# Patient Record
Sex: Female | Born: 1980 | Race: White | Hispanic: No | Marital: Single | Smoking: Never smoker
Health system: Southern US, Community
[De-identification: ages and names within clinical notes are randomized; demographics above are authoritative.]

## PROBLEM LIST (undated history)

## (undated) DIAGNOSIS — I10 Essential (primary) hypertension: Secondary | ICD-10-CM

## (undated) DIAGNOSIS — E785 Hyperlipidemia, unspecified: Secondary | ICD-10-CM

## (undated) DIAGNOSIS — E119 Type 2 diabetes mellitus without complications: Secondary | ICD-10-CM

## (undated) HISTORY — PX: TONSILLECTOMY AND ADENOIDECTOMY: SUR1326

## (undated) HISTORY — PX: TUBAL LIGATION: SHX77

## (undated) HISTORY — PX: WISDOM TOOTH EXTRACTION: SHX21

## (undated) HISTORY — DX: Hyperlipidemia, unspecified: E78.5

## (undated) HISTORY — DX: Type 2 diabetes mellitus without complications: E11.9

## (undated) HISTORY — DX: Essential (primary) hypertension: I10

## (undated) HISTORY — PX: MYRINGOTOMY: SUR874

---

## 2011-04-21 ENCOUNTER — Emergency Department (HOSPITAL_COMMUNITY)
Admission: EM | Admit: 2011-04-21 | Discharge: 2011-04-21 | Disposition: A | Discharge status: Other Acute Inpatient Hospital | Payer: Medicaid Other | Attending: Emergency Medicine | Admitting: Emergency Medicine

## 2011-04-21 ENCOUNTER — Encounter: Payer: Self-pay | Admitting: *Deleted

## 2011-04-21 DIAGNOSIS — J069 Acute upper respiratory infection, unspecified: Secondary | ICD-10-CM | POA: Insufficient documentation

## 2011-04-21 DIAGNOSIS — H9209 Otalgia, unspecified ear: Secondary | ICD-10-CM | POA: Insufficient documentation

## 2011-04-21 DIAGNOSIS — J3489 Other specified disorders of nose and nasal sinuses: Secondary | ICD-10-CM | POA: Insufficient documentation

## 2011-04-21 DIAGNOSIS — S0085XA Superficial foreign body of other part of head, initial encounter: Secondary | ICD-10-CM | POA: Insufficient documentation

## 2011-04-21 DIAGNOSIS — R05 Cough: Secondary | ICD-10-CM | POA: Insufficient documentation

## 2011-04-21 DIAGNOSIS — S1095XA Superficial foreign body of unspecified part of neck, initial encounter: Secondary | ICD-10-CM | POA: Insufficient documentation

## 2011-04-21 DIAGNOSIS — X58XXXA Exposure to other specified factors, initial encounter: Secondary | ICD-10-CM | POA: Insufficient documentation

## 2011-04-21 DIAGNOSIS — S0005XA Superficial foreign body of scalp, initial encounter: Secondary | ICD-10-CM | POA: Insufficient documentation

## 2011-04-21 DIAGNOSIS — J343 Hypertrophy of nasal turbinates: Secondary | ICD-10-CM | POA: Insufficient documentation

## 2011-04-21 DIAGNOSIS — R059 Cough, unspecified: Secondary | ICD-10-CM | POA: Insufficient documentation

## 2011-04-21 DIAGNOSIS — R6889 Other general symptoms and signs: Secondary | ICD-10-CM | POA: Insufficient documentation

## 2011-04-21 DIAGNOSIS — T148XXA Other injury of unspecified body region, initial encounter: Secondary | ICD-10-CM

## 2011-04-21 NOTE — ED Notes (Signed)
The pt has had a cold and congestion for one week and she has swollen glands in her neck since yesterday

## 2011-04-21 NOTE — ED Provider Notes (Signed)
History     CSN: [id number] Arrival date & time: 04/21/2011  3:44 PM   Chief Complaint  Patient presents with  . Nasal Congestion    HPI Pt was seen at 1725.  Per pt, c/o gradual onset and persistence of constant runny/stuffy nose, ears and sinus congestion, cough x1 week.  Denies sore throat, no CP/palpitations, no SOB, no rash, no fevers, no abd pain, no N/V/D.      History reviewed. No pertinent past medical history.  History reviewed. No pertinent past surgical history.   History  Substance Use Topics  . Smoking status: Not on file  . Smokeless tobacco: Not on file  . Alcohol Use: No    Review of Systems ROS: Statement: All systems negative except as marked or noted in the HPI; Constitutional: Negative for fever and chills. ; ; Eyes: Negative for eye pain, redness and discharge. ; ; ENMT: Positive for ear pain, nasal congestion, sinus pressure and negative for sore throat. ; ; Cardiovascular: Negative for chest pain, palpitations, diaphoresis, dyspnea and peripheral edema. ; ; Respiratory: +cough. Negative for wheezing and stridor. ; ; Gastrointestinal: Negative for nausea, vomiting, diarrhea and abdominal pain, blood in stool, hematemesis, jaundice and rectal bleeding. . ; ; Genitourinary: Negative for dysuria, flank pain and hematuria. ; ; Musculoskeletal: Negative for back pain and neck pain. Negative for swelling and trauma.; ; Skin: Negative for pruritus, rash, abrasions, blisters, bruising and skin lesion.; ; Neuro: Negative for headache, lightheadedness and neck stiffness. Negative for weakness, altered level of consciousness , altered mental status, extremity weakness, paresthesias, involuntary movement, seizure and syncope.     Allergies  Review of patient's allergies indicates no known allergies.  Home Medications   Current Outpatient Rx  Name Route Sig Dispense Refill  . OMEGA-3 FATTY ACIDS 1000 MG PO CAPS Oral Take 1 g by mouth daily.      Marland Kitchen HYDROCHLOROTHIAZIDE  25 MG PO TABS Oral Take 25 mg by mouth daily.      Marland Kitchen METFORMIN HCL 500 MG PO TABS Oral Take 500 mg by mouth 2 (two) times daily with a meal.      . PRAVASTATIN SODIUM 40 MG PO TABS Oral Take 40 mg by mouth at bedtime.        BP 121/68  Pulse 86  Temp(Src) 98.9 F (37.2 C) (Oral)  Resp 16  Ht 5\' 4"  (1.626 m)  Wt 200 lb (90.719 kg)  BMI 34.33 kg/m2  SpO2 96%  LMP 03/21/2011  Physical Exam 1730: Physical examination:  Nursing notes reviewed; Vital signs and O2 SAT reviewed;  Constitutional: Well developed, Well nourished, Well hydrated, In no acute distress; Head:  Normocephalic, atraumatic; Eyes: EOMI, PERRL, No scleral icterus; ENMT: +clear fluid levels behind TM's bilat.  +edemetous nasal turbinates bilat with clear rhinorrhea. Mouth and pharynx normal, Mucous membranes moist; Neck: Supple, Full range of motion, No lymphadenopathy; Cardiovascular: Regular rate and rhythm, No murmur, rub, or gallop; Respiratory: Breath sounds clear & equal bilaterally, No rales, rhonchi, wheezes, or rub, Normal respiratory effort/excursion; Chest: Nontender, Movement normal; Abdomen: Soft, Nontender, Nondistended, Normal bowel sounds; Extremities: Pulses normal, No tenderness, No edema, No calf edema or asymmetry.; Neuro: AA&Ox3, Major CN grossly intact.  No gross focal motor or sensory deficits in extremities.; Skin: Color normal, Warm, Dry.  No rash.  +very small splinter left thenar eminence without surrounding erythema, edema or drainage.    ED Course  Procedures   MDM  MDM Reviewed: nursing note and vitals  Superficial small splinter removed intact from left thenar eminence.  Wants to go home now.  Will d/c.      Ronin Crager Allison Quarry, DO 04/23/11 [phone]

## 2011-04-21 NOTE — ED Notes (Signed)
Pt discharged. No further questions. Vital signs stable.

## 2011-04-21 NOTE — ED Notes (Signed)
Pt presents to department for evaluation of cough, sore throat and cold symptoms. Ongoing x1 week. Lung sounds clear and equal bilaterally. Respirations unlabored. Skin warm and dry. 7/10 sore throat. Pt conscious alert and oriented x4. No signs of distress at present.

## 2018-02-07 ENCOUNTER — Other Ambulatory Visit: Payer: Self-pay | Admitting: Occupational Medicine

## 2018-02-07 ENCOUNTER — Ambulatory Visit: Payer: Self-pay

## 2018-02-07 DIAGNOSIS — M79605 Pain in left leg: Secondary | ICD-10-CM

## 2020-03-07 IMAGING — DX DG TIBIA/FIBULA 2V*L*
4 series · 4 of 4 positions shown · non-contrast
Comparison: None.

CLINICAL DATA: 37-year-old female with a chronic ulceration
anterior to the left tib-fib. Patient sustained a fall on
12/20/2017.

EXAM:
LEFT TIBIA AND FIBULA - 2 VIEW

[tibia ap (1 of 2)]
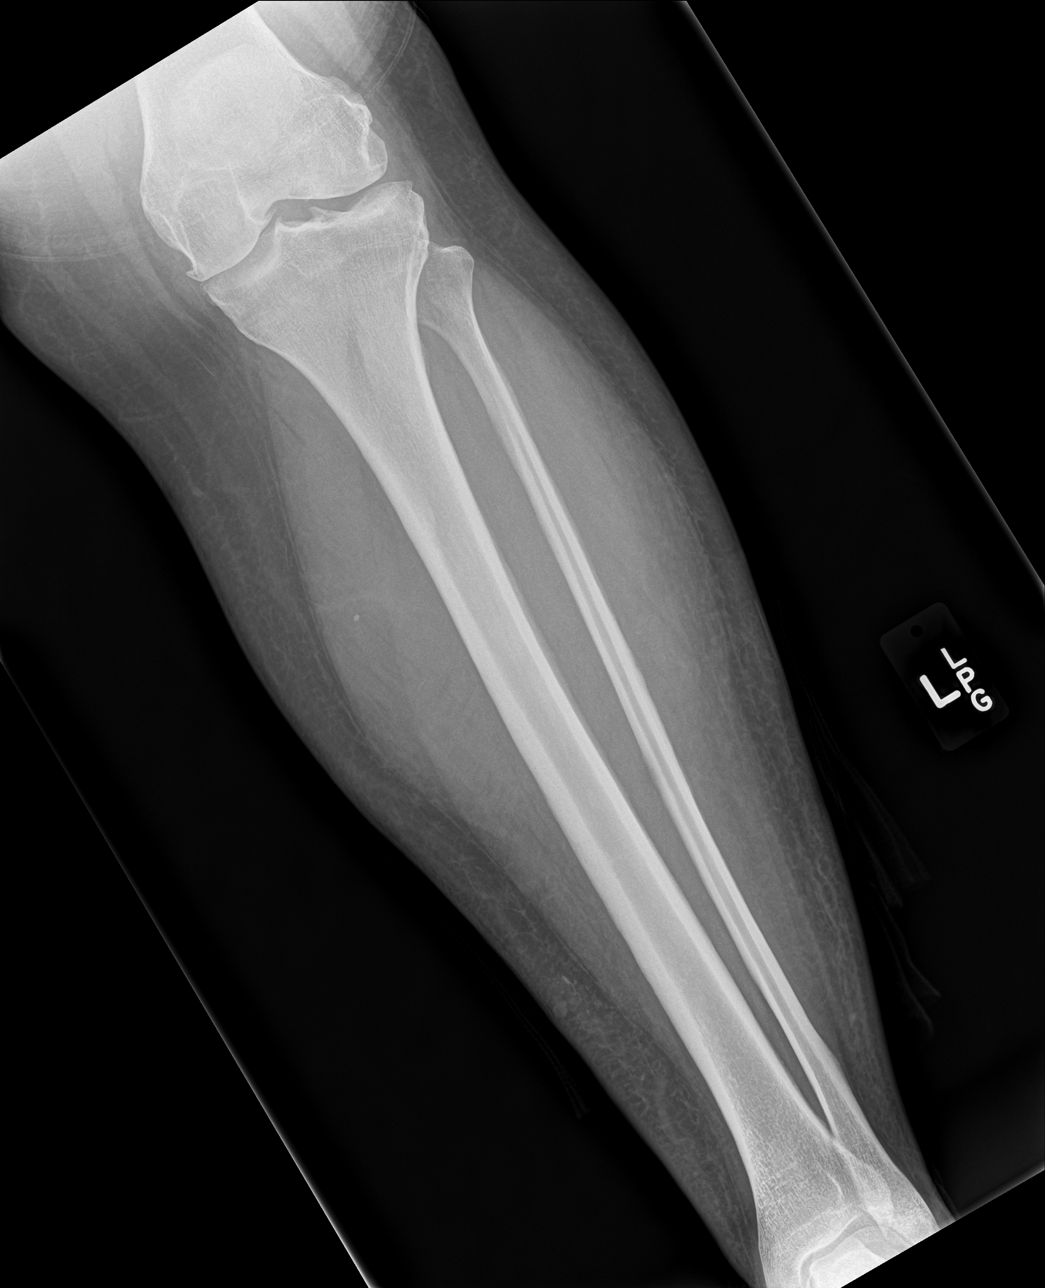

[tibia ap (2 of 2)]
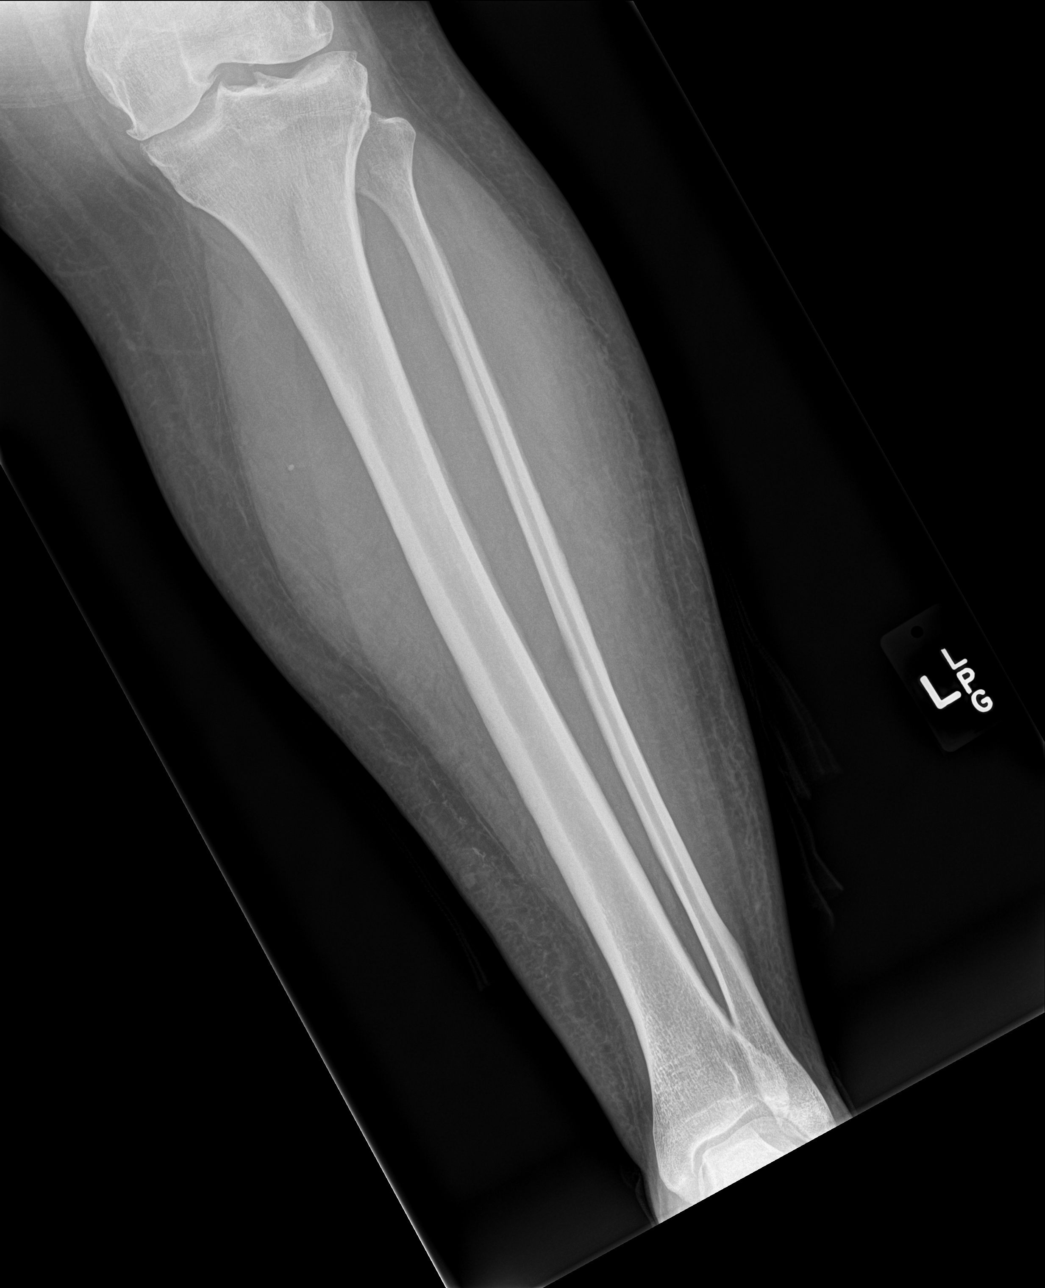

[tibia lat (1 of 2)]
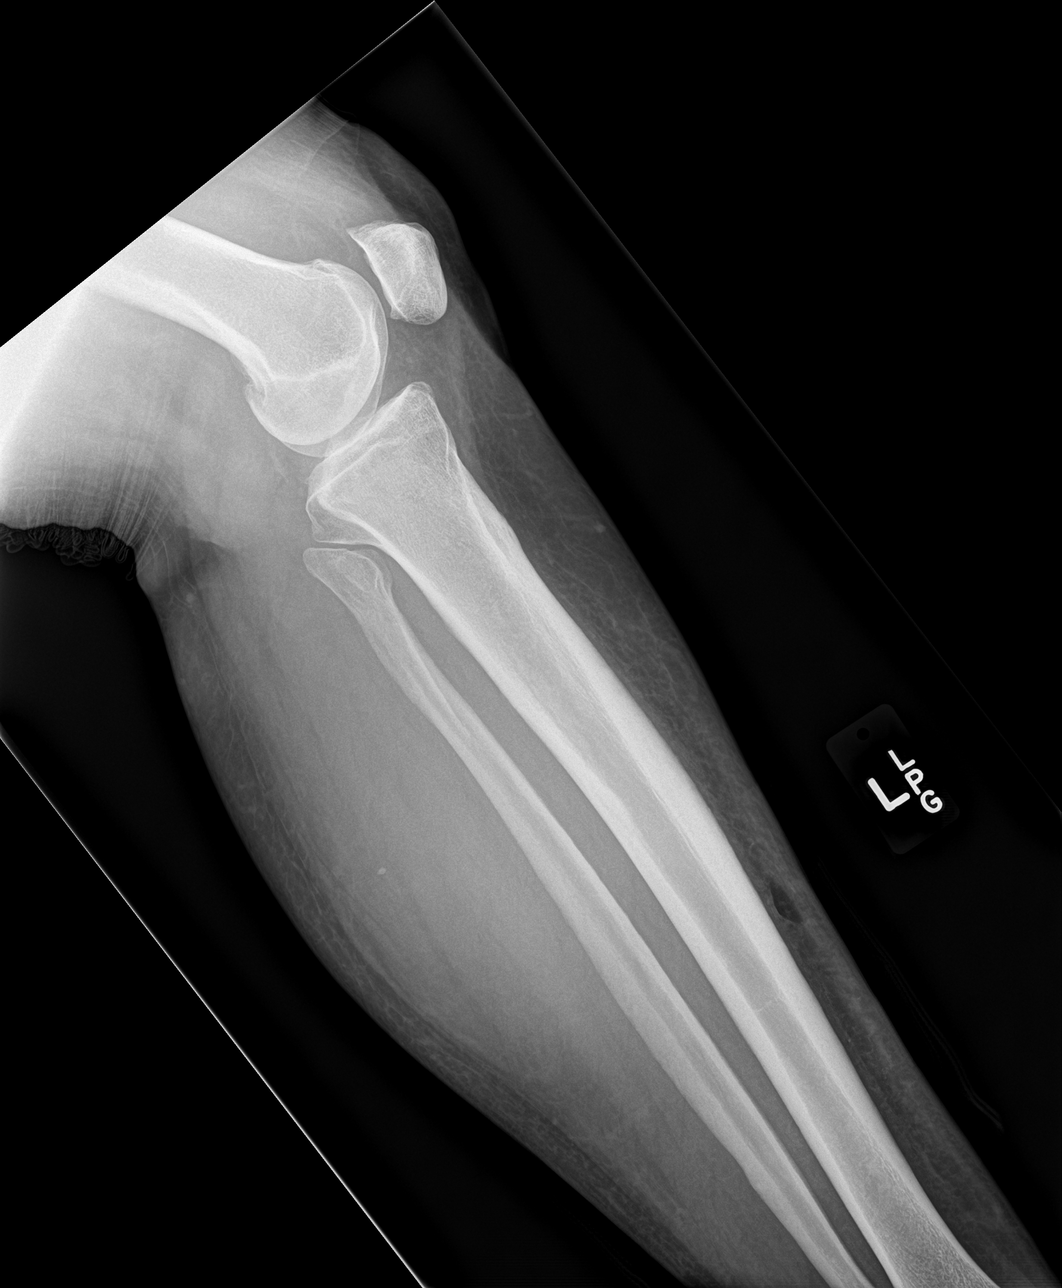

[tibia lat (2 of 2)]
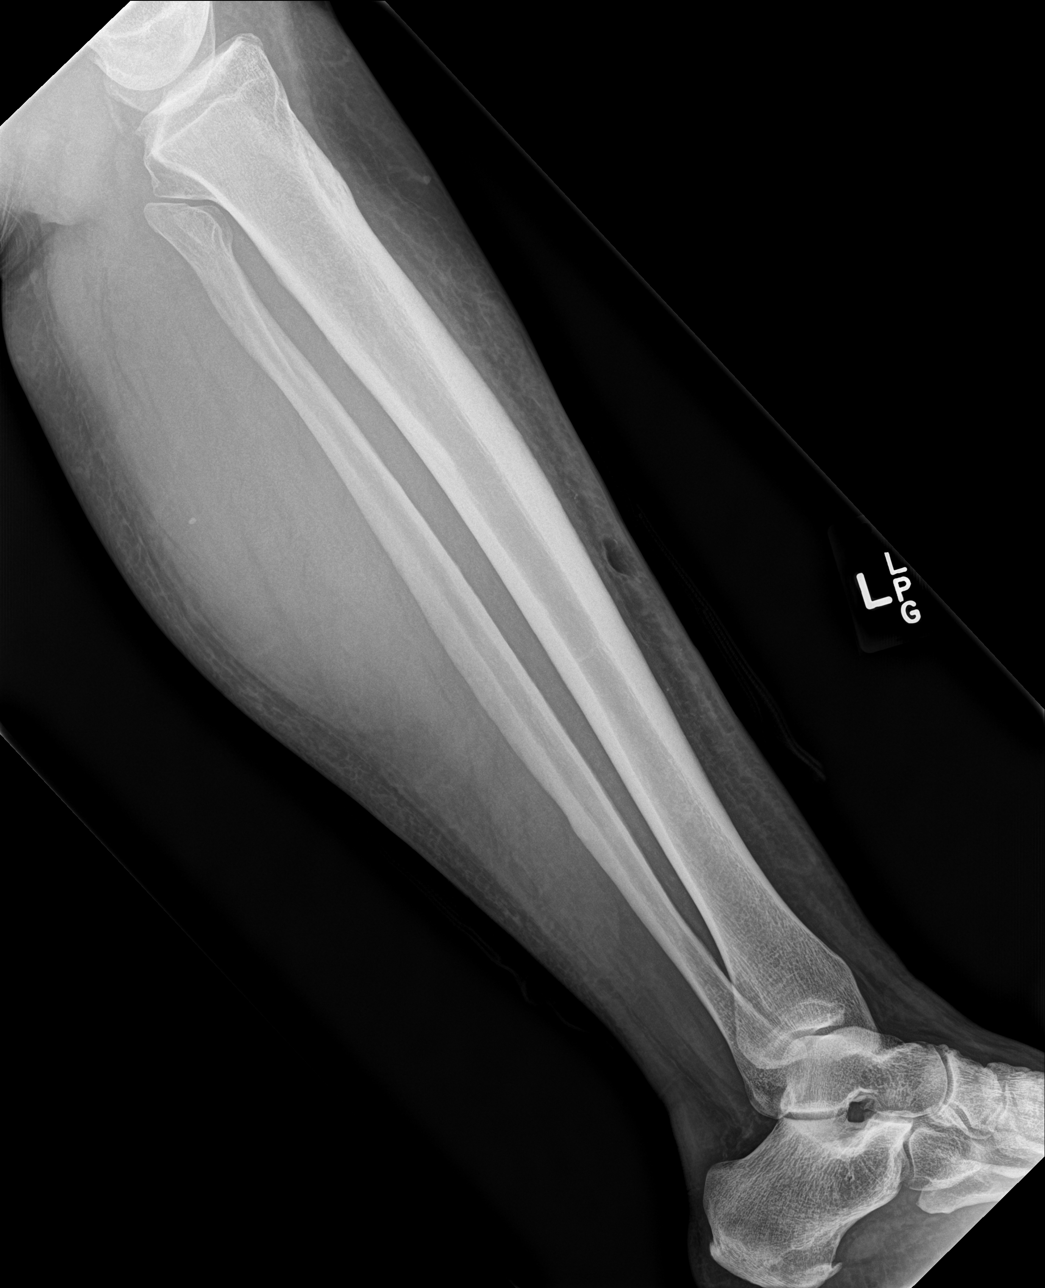

[4 of 4 positions shown; findings below may reference images not displayed]

FINDINGS: No evidence of acute fracture or malalignment. No lytic or blastic
osseous lesion. No evidence of osteomyelitis. A small defect in the
skin overlying the mid shaft of the tibia is consistent with the
clinical history of ulceration. Degenerative changes are present in
the knee joint, most significant in the medial compartment. No
significant visible peripheral arterial calcifications.
IMPRESSION: 1. Soft tissue irregularity consistent with the clinical history of
ulceration anterior to the mid tibia.
2. No evidence of underlying osseous abnormality.
3. Incidentally noted osteoarthritis of the knee joint most
significant in the medial compartment.

## 2022-11-30 NOTE — Progress Notes (Unsigned)
[facility]  [address] [place],  [place]  [address] [phone]  Clinic Day:  12/01/2022  Referring physician: Sheryle Hail*  HISTORY OF PRESENT ILLNESS:  The patient is a 42 y.o. female who I was asked to consult upon for a splenic infarction.  According to the patient, she developed sudden left-sided abdominal pain 2 weekends ago.  Over 24 hours, this pain became unrelenting to where she went to a local ER the following day.  A CT scan of her abdomen was done, which showed massive splenomegaly, as well as a splenic infarct.   She was ultimately transferred to [facility] in [place]; she was placed on a heparin drip before being switched to Coumadin, for which she takes 4 mg daily.  A hypercoagulable workup done at [facility] in [place] revealed the following:  Protein C Activity 72 (68-270%) Protein S Activity 40 (low; 64 -147%) Factor V Leiden Mutation NEGATIVE Antithrombin III Activity 90 (80 - 130%) POSITIVE Lupus Anticoagulant IgM Beta-2 Glycoprotein AB 7.0 (<20) IgG Beta-2 Glycoprotein AB <1.0 (<20) IgM Anticardiolipin AB >60 (0-11) IgG Anticardiolipin AB 7.0 (0 -23)  The patient denies having a history of blood clots. She denied having any trauma to her abdomen. She denied having any recent viral infections which could have triggered her splenomegaly.  She denies having any B symptoms which could suggest her massive splenomegaly is due an underlying hematologic malignancy.  To her knowledge, there is no family history of blood clots.  Overall, the patient denies having any particular changes in her health over these past weeks/months that could have contributed to her splenic complications. A CBC done on June 5th showed a white count of 9.1, a low hemoglobin of 9.5 and a normal platelet count of 205.   PAST MEDICAL HISTORY:   Past Medical History:  Diagnosis Date   Diabetes mellitus without complication (HCC)    Hyperlipidemia     Hypertension    PAST SURGICAL HISTORY:   Past Surgical History:  Procedure Laterality Date   CESAREAN SECTION     X 1   MYRINGOTOMY Bilateral    TONSILLECTOMY AND ADENOIDECTOMY     TUBAL LIGATION Bilateral    WISDOM TOOTH EXTRACTION     CURRENT MEDICATIONS:   Current Outpatient Medications  Medication Sig Dispense Refill   atorvastatin (LIPITOR) 40 MG tablet Take 1 tablet by mouth daily.     ferrous sulfate 325 (65 FE) MG tablet Take by mouth.     lisinopril (ZESTRIL) 2.5 MG tablet Take 1 tablet by mouth daily.     warfarin (COUMADIN) 4 MG tablet Take 1 tablet by mouth daily.     metFORMIN (GLUCOPHAGE) 500 MG tablet Take 500 mg by mouth 2 (two) times daily with a meal.       pravastatin (PRAVACHOL) 40 MG tablet Take 40 mg by mouth at bedtime.       No current facility-administered medications for this visit.    ALLERGIES:  No Known Allergies  FAMILY HISTORY:   Family History  Problem Relation Age of Onset   Diabetes Mother    Heart disease Mother    Hypertension Mother    Hyperlipidemia Mother    Diabetes Father    Heart disease Father    Hypertension Father    Hyperlipidemia Father     SOCIAL HISTORY:  The patient was born in [place].  She lives in [place].  She has a longtime boyfriend and a 78 year old  son.  She works at a [employer].  There is no history of alcohol or tobacco abuse.    REVIEW OF SYSTEMS:  Review of Systems  Constitutional:  Negative for fatigue and fever.  HENT:   Negative for hearing loss and sore throat.   Eyes:  Negative for eye problems.  Respiratory:  Negative for chest tightness, cough and hemoptysis.   Cardiovascular:  Negative for chest pain and palpitations.  Gastrointestinal:  Negative for abdominal distention, abdominal pain, blood in stool, constipation, diarrhea, nausea and vomiting.  Endocrine: Negative for hot flashes.  Genitourinary:  Negative for difficulty urinating, dysuria, frequency, hematuria  and nocturia.   Musculoskeletal:  Negative for arthralgias, back pain, gait problem and myalgias.  Skin: Negative.  Negative for itching and rash.  Neurological: Negative.  Negative for dizziness, extremity weakness, gait problem, headaches, light-headedness and numbness.  Hematological: Negative.   Psychiatric/Behavioral: Negative.  Negative for depression and suicidal ideas. The patient is not nervous/anxious.      PHYSICAL EXAM:  Blood pressure (!) 129/58, pulse 98, temperature 99.4 F (37.4 C), resp. rate 16, height 5\' 6"  (1.676 m), weight 208 lb 3.2 oz (94.4 kg), SpO2 95 %. Wt Readings from Last 3 Encounters:  12/01/22 208 lb 3.2 oz (94.4 kg)  04/21/11 200 lb (90.7 kg)   Body mass index is 33.6 kg/m. Performance status (ECOG): 0 - Asymptomatic Physical Exam Constitutional:      Appearance: Normal appearance. She is not ill-appearing.  HENT:     Mouth/Throat:     Mouth: Mucous membranes are moist.     Pharynx: Oropharynx is clear. No oropharyngeal exudate or posterior oropharyngeal erythema.  Cardiovascular:     Rate and Rhythm: Normal rate and regular rhythm.     Heart sounds: No murmur heard.    No friction rub. No gallop.  Pulmonary:     Effort: Pulmonary effort is normal. No respiratory distress.     Breath sounds: Normal breath sounds. No wheezing, rhonchi or rales.  Abdominal:     General: Bowel sounds are normal. There is no distension.     Palpations: Abdomen is soft. There is splenomegaly (spleen palpated 6 cm below left costal margin). There is no mass.     Tenderness: There is no abdominal tenderness.  Musculoskeletal:        General: No swelling.     Right lower leg: No edema.     Left lower leg: No edema.  Lymphadenopathy:     Cervical: No cervical adenopathy.     Upper Body:     Right upper body: No supraclavicular or axillary adenopathy.     Left upper body: No supraclavicular or axillary adenopathy.     Lower Body: No right inguinal adenopathy. No left  inguinal adenopathy.  Skin:    General: Skin is warm.     Coloration: Skin is not jaundiced.     Findings: No lesion or rash.  Neurological:     General: No focal deficit present.     Mental Status: She is alert and oriented to person, place, and time. Mental status is at baseline.  Psychiatric:        Mood and Affect: Mood normal.        Behavior: Behavior normal.        Thought Content: Thought content normal.    STUDIES:  CT scans of her abdomen/pelvis on 11-15-2022 revealed the following:  FINDINGS:   LOWER CHEST: Dependent atelectasis in the right greater than  left lung bases. No pleural or pericardial effusion.   LIVER: Normal liver contour. The liver is at the upper limit of normal for size. No focal liver lesions.   BILIARY: The gallbladder is contracted, limiting evaluation, but appears normal. No biliary ductal dilatation.   SPLEEN: Marked splenomegaly measuring up to 20.3 cm in craniocaudal dimension. There is a wedge-shaped area of hypoattenuation in the inferior spleen measuring approximately 5.9 x 6.1 x 5.3 cm (2:67, 4:66) consistent with splenic infarction. There is tracking free fluid or hemorrhagic products adjacent to the inferior spleen.   PANCREAS: Normal pancreatic contour.  No focal lesions.  No ductal dilation.   ADRENAL GLANDS: Normal appearance of the adrenal glands.   KIDNEYS/URETERS: Symmetric renal enhancement. Mild mass effect on the left kidney from the spleen. No hydronephrosis.  No solid renal mass.   BLADDER: Unremarkable.   REPRODUCTIVE ORGANS: Anteverted uterus. Bilateral ovarian cyst measuring up to 4 cm on the left and 3.9 cm on the right which do not meet criteria for additional imaging or follow-up.   GI TRACT: No findings of bowel obstruction or acute inflammation. Scattered colonic diverticulosis. Normal appendix.   PERITONEUM, RETROPERITONEUM AND MESENTERY: No free air.  No ascites.  No fluid collection.   LYMPH NODES: No adenopathy.    VESSELS: Hepatic and portal veins are patent.  Normal caliber aorta.    BONES and SOFT TISSUES: No aggressive osseous lesions.  No focal soft tissue lesions.   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 The etiology of splenomegaly has not been determined at this time.  Chronic and incidental findings as detailed in the body of the report.   ASSESSMENT & PLAN:  A 42 y.o. female who I was asked to consult upon for the development of a splenic infarct in the setting of massive splenomegaly.  The patient has already undergone a hypercoagulable workup, for which some findings were abnormal.  I will repeat her anticardiolipin levels and her lupus anticoagulant screen just to see if the suspicion for antiphospholipid syndrome still exists.  A complete protein C and protein S panel will also be checked.  The patient understands her protein C and S levels may come back abnormal as both proteins of vitamin K dependent.  As she is on Coumadin, this should drop down her protein C and S levels.  As her prothrombin gene mutation was not checked, this study will be ordered.  The current thinking is some type of clotting phenomenon led to the splenic infarct developing.  This is what has her on Coumadin 4 mg daily.  She knows to continue taking this on a daily basis.  My only concern is whether her massive splenomegaly got to the point where it was outstripping her blood supply to where it led to the development of a splenic infarct.   Per her CT scan report, there is no other lymphadenopathy or findings elsewhere to suggest a hematologic malignancy or systemic disease process is present.  I will also check a CBC to ensure her blood counts have not fallen, as it pertains to her massive splenomegaly.  Her LDH level will also be rechecked.  I will see this patient back in 3 weeks to go over all of her labs collected  today, which be used to formulate her next course of action for the management of her splenic infarct..  The patient understands all the plans discussed today and is  in agreement with them.  I do appreciate Sheryle Hail* for his new consult.   Ronav Furney Kirby Funk, MD

## 2022-12-01 ENCOUNTER — Inpatient Hospital Stay: Payer: BC Managed Care – PPO

## 2022-12-01 ENCOUNTER — Inpatient Hospital Stay: Payer: BC Managed Care – PPO | Attending: Oncology | Admitting: Oncology

## 2022-12-01 ENCOUNTER — Other Ambulatory Visit: Payer: Self-pay | Admitting: Oncology

## 2022-12-01 ENCOUNTER — Encounter: Payer: Self-pay | Admitting: Oncology

## 2022-12-01 VITALS — BP 129/58 | HR 98 | Temp 99.4°F | Resp 16 | Ht 66.0 in | Wt 208.2 lb

## 2022-12-01 DIAGNOSIS — E119 Type 2 diabetes mellitus without complications: Secondary | ICD-10-CM | POA: Insufficient documentation

## 2022-12-01 DIAGNOSIS — D735 Infarction of spleen: Secondary | ICD-10-CM | POA: Diagnosis not present

## 2022-12-01 DIAGNOSIS — D649 Anemia, unspecified: Secondary | ICD-10-CM | POA: Insufficient documentation

## 2022-12-01 DIAGNOSIS — E785 Hyperlipidemia, unspecified: Secondary | ICD-10-CM | POA: Insufficient documentation

## 2022-12-01 DIAGNOSIS — I10 Essential (primary) hypertension: Secondary | ICD-10-CM | POA: Insufficient documentation

## 2022-12-01 DIAGNOSIS — Z7901 Long term (current) use of anticoagulants: Secondary | ICD-10-CM | POA: Diagnosis not present

## 2022-12-01 DIAGNOSIS — R161 Splenomegaly, not elsewhere classified: Secondary | ICD-10-CM | POA: Diagnosis not present

## 2022-12-01 DIAGNOSIS — D6862 Lupus anticoagulant syndrome: Secondary | ICD-10-CM

## 2022-12-01 DIAGNOSIS — Z7984 Long term (current) use of oral hypoglycemic drugs: Secondary | ICD-10-CM | POA: Insufficient documentation

## 2022-12-01 DIAGNOSIS — Z79899 Other long term (current) drug therapy: Secondary | ICD-10-CM

## 2022-12-03 ENCOUNTER — Inpatient Hospital Stay: Payer: BC Managed Care – PPO

## 2022-12-03 DIAGNOSIS — E119 Type 2 diabetes mellitus without complications: Secondary | ICD-10-CM | POA: Diagnosis not present

## 2022-12-03 DIAGNOSIS — Z7901 Long term (current) use of anticoagulants: Secondary | ICD-10-CM | POA: Diagnosis not present

## 2022-12-03 DIAGNOSIS — Z7984 Long term (current) use of oral hypoglycemic drugs: Secondary | ICD-10-CM | POA: Diagnosis not present

## 2022-12-03 DIAGNOSIS — D6862 Lupus anticoagulant syndrome: Secondary | ICD-10-CM | POA: Diagnosis not present

## 2022-12-03 DIAGNOSIS — Z79899 Other long term (current) drug therapy: Secondary | ICD-10-CM | POA: Diagnosis not present

## 2022-12-03 DIAGNOSIS — D649 Anemia, unspecified: Secondary | ICD-10-CM | POA: Diagnosis not present

## 2022-12-03 DIAGNOSIS — E785 Hyperlipidemia, unspecified: Secondary | ICD-10-CM | POA: Diagnosis not present

## 2022-12-03 DIAGNOSIS — R161 Splenomegaly, not elsewhere classified: Secondary | ICD-10-CM | POA: Diagnosis not present

## 2022-12-03 DIAGNOSIS — D735 Infarction of spleen: Secondary | ICD-10-CM

## 2022-12-03 DIAGNOSIS — I10 Essential (primary) hypertension: Secondary | ICD-10-CM | POA: Diagnosis not present

## 2022-12-03 LAB — CBC WITH DIFFERENTIAL (CANCER CENTER ONLY)
Abs Immature Granulocytes: 0.03 10*3/uL (ref 0.00–0.07)
Basophils Absolute: 0 10*3/uL (ref 0.0–0.1)
Basophils Relative: 1 %
Eosinophils Absolute: 0.1 10*3/uL (ref 0.0–0.5)
Eosinophils Relative: 1 %
HCT: 35.2 % — ABNORMAL LOW (ref 36.0–46.0)
Hemoglobin: 10.6 g/dL — ABNORMAL LOW (ref 12.0–15.0)
Immature Granulocytes: 0 %
Lymphocytes Relative: 44 %
Lymphs Abs: 3.3 10*3/uL (ref 0.7–4.0)
MCH: 24.3 pg — ABNORMAL LOW (ref 26.0–34.0)
MCHC: 30.1 g/dL (ref 30.0–36.0)
MCV: 80.7 fL (ref 80.0–100.0)
Monocytes Absolute: 0.5 10*3/uL (ref 0.1–1.0)
Monocytes Relative: 6 %
Neutro Abs: 3.6 10*3/uL (ref 1.7–7.7)
Neutrophils Relative %: 48 %
Platelet Count: 346 10*3/uL (ref 150–400)
RBC: 4.36 MIL/uL (ref 3.87–5.11)
RDW: 17.2 % — ABNORMAL HIGH (ref 11.5–15.5)
WBC Count: 7.5 10*3/uL (ref 4.0–10.5)
nRBC: 0 % (ref 0.0–0.2)

## 2022-12-03 LAB — CMP (CANCER CENTER ONLY)
ALT: 17 U/L (ref 0–44)
AST: 20 U/L (ref 15–41)
Albumin: 3.7 g/dL (ref 3.5–5.0)
Alkaline Phosphatase: 113 U/L (ref 38–126)
Anion gap: 10 (ref 5–15)
BUN: 15 mg/dL (ref 6–20)
CO2: 26 mmol/L (ref 22–32)
Calcium: 9.2 mg/dL (ref 8.9–10.3)
Chloride: 101 mmol/L (ref 98–111)
Creatinine: 0.67 mg/dL (ref 0.44–1.00)
GFR, Estimated: >60 mL/min (ref >60)
Glucose, Bld: 82 mg/dL (ref 70–99)
Potassium: 4.2 mmol/L (ref 3.5–5.1)
Sodium: 137 mmol/L (ref 135–145)
Total Bilirubin: 0.6 mg/dL (ref 0.3–1.2)
Total Protein: 7.5 g/dL (ref 6.5–8.1)

## 2022-12-03 LAB — LACTATE DEHYDROGENASE: LDH: 294 U/L — ABNORMAL HIGH (ref 98–192)

## 2022-12-04 LAB — LUPUS ANTICOAGULANT
DRVVT: 51.2 s — ABNORMAL HIGH (ref 0.0–47.0)
PTT Lupus Anticoagulant: 47.2 s — ABNORMAL HIGH (ref 0.0–43.5)
Thrombin Time: 15.4 s (ref 0.0–23.0)
dPT Confirm Ratio: 1.17 Ratio (ref 0.00–1.34)
dPT: 104.6 s — ABNORMAL HIGH (ref 0.0–47.6)

## 2022-12-04 LAB — HEXAGONAL PHASE PHOSPHOLIPID: Hexagonal Phase Phospholipid: 19 s — ABNORMAL HIGH (ref 0–11)

## 2022-12-04 LAB — DRVVT MIX: dRVVT Mix: 42.7 s — ABNORMAL HIGH (ref 0.0–40.4)

## 2022-12-04 LAB — DRVVT CONFIRM: dRVVT Confirm: 0.9 ratio (ref 0.8–1.2)

## 2022-12-04 LAB — PTT-LA MIX: PTT-LA Mix: 41.7 s — ABNORMAL HIGH (ref 0.0–40.5)

## 2022-12-05 LAB — CARDIOLIPIN ANTIBODIES, IGG, IGM, IGA
Anticardiolipin IgA: 16 APL U/mL — ABNORMAL HIGH (ref 0–11)
Anticardiolipin IgG: <9 GPL U/mL (ref 0–14)
Anticardiolipin IgM: >150 MPL U/mL — ABNORMAL HIGH (ref 0–12)

## 2022-12-05 LAB — PROTEIN C, TOTAL: Protein C, Total: 72 % (ref 60–150)

## 2022-12-16 LAB — PROTHROMBIN GENE MUTATION

## 2022-12-25 ENCOUNTER — Inpatient Hospital Stay: Payer: BC Managed Care – PPO | Attending: Oncology | Admitting: Oncology

## 2022-12-25 ENCOUNTER — Other Ambulatory Visit: Payer: Self-pay | Admitting: Oncology

## 2022-12-25 VITALS — BP 140/62 | HR 77 | Temp 98.9°F | Resp 16 | Ht 66.0 in | Wt 209.3 lb

## 2022-12-25 DIAGNOSIS — D735 Infarction of spleen: Secondary | ICD-10-CM

## 2022-12-25 NOTE — Progress Notes (Unsigned)
Alexis Frazier Alexis Frazier  [place],  [place]  [address] [phone]  Clinic Day: 12/25/2022  Referring physician: Yvonne Kendall, Alexis  HISTORY OF PRESENT ILLNESS:  The patient is a 42 y.o. 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  She comes in today to go over labs to determine if she has an underlying hypercoagulable disorder.  Since her last visit, the patient has been doing well.  She remains compliant with her warfarin.  She only notices mild abdominal discomfort with a more extreme turn of her torso.  Initial labs from [facility] in June 2024 had shown the following:  Protein C Activity 72 (68-270%) Protein S Activity 40 (low; 64 -147%) Factor V Leiden Mutation NEGATIVE Antithrombin III Activity 90 (80 - 130%) POSITIVE Lupus Anticoagulant IgM Beta-2 Glycoprotein AB 7.0 (<20) IgG Beta-2 Glycoprotein AB <1.0 (<20) IgM Anticardiolipin AB >60 (0-11) IgG Anticardiolipin AB 7.0 (0 -23)  PHYSICAL EXAM:  Blood pressure (!) 140/62, pulse 77, temperature 98.9 F (37.2 C), resp. rate 16, height 5\' 6"  (1.676 m), weight 209 lb 4.8 oz (94.9 kg), SpO2 100%. Wt Readings from Last 3 Encounters:  12/25/22 209 lb 4.8 oz (94.9 kg)  12/01/22 208 lb 3.2 oz (94.4 kg)  04/21/11 200 lb (90.7 kg)   Body mass index is 33.78 kg/m. Performance status (ECOG): 0 - Asymptomatic Physical Exam Constitutional:      Appearance: Normal appearance. She is not ill-appearing.  HENT:     Mouth/Throat:     Mouth: Mucous membranes are moist.     Pharynx: Oropharynx is clear. No oropharyngeal exudate or posterior oropharyngeal erythema.  Cardiovascular:     Rate and Rhythm: Normal rate and regular rhythm.     Heart sounds: No murmur heard.    No friction rub. No gallop.  Pulmonary:     Effort: Pulmonary effort is normal. No respiratory distress.     Breath sounds: Normal breath sounds. No  wheezing, rhonchi or rales.  Abdominal:     General: Bowel sounds are normal. There is no distension.     Palpations: Abdomen is soft. There is splenomegaly (spleen palpated 6 cm below left costal margin). There is no mass.     Tenderness: There is no abdominal tenderness.  Musculoskeletal:        General: No swelling.     Right lower leg: No edema.     Left lower leg: No edema.  Lymphadenopathy:     Cervical: No cervical adenopathy.     Upper Body:     Right upper body: No supraclavicular or axillary adenopathy.     Left upper body: No supraclavicular or axillary adenopathy.     Lower Body: No right inguinal adenopathy. No left inguinal adenopathy.  Skin:    General: Skin is warm.     Coloration: Skin is not jaundiced.     Findings: No lesion or rash.  Neurological:     General: No focal deficit present.     Mental Status: She is alert and oriented to person, place, and time. Mental status is at baseline.  Psychiatric:        Mood and Affect: Mood normal.        Behavior: Behavior normal.        Thought Content: Thought content normal.    LABS:  Latest Reference Range & Units 12/03/22 14:26  Anticardiolipin Ab,IgA,Qn 0 - 11 APL U/mL  16 (H)  Anticardiolipin Ab,IgG,Qn 0 - 14 GPL U/mL <9  Anticardiolipin Ab,IgM,Qn 0 - 12 MPL U/mL >150 (H)  PTT Lupus Anticoagulant 0.0 - 43.5 sec 47.2 (H)  DRVVT 0.0 - 47.0 sec 51.2 (H)  Lupus Anticoag Interp  Comment:Results are consistent with the presence of a lupus anticoagulant.   Hexagonal Phase Phospholipid 0 - 11 sec 19 (H)  PTT-LA Mix 0.0 - 40.5 sec 41.7 (H)  Recommendations-PTGENE:  Comment: Not Detected   (H): Data is abnormally high  Latest Reference Range & Units 12/03/22 14:26  Protein C, Total 60 - 150 % 72   ASSESSMENT & PLAN:  A 42 y.o. 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  In clinic today, I reviewed her recently collected labs with her.  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  This was the 2nd time where this study came back elevated, but both studies were collected in June 2024.  In order for her to meet the diagnosis of antiphospholipid syndrome, this particular lab needs to remain elevated 3 months apart from when it was first checked.  Based upon this, I will recheck this value in September 2024.  If it is still very elevated at that time, she would meet the criteria for having antiphospholipid syndrome for which lifelong anticoagulation would be recommended.  She knows to remain on warfarin until further notice.  I will also check her CBC in September just to ensure her splenomegaly is not leading to progressive pancytopenia over time.  If so, a splenectomy may need to be considered.  The patient understands all the plans discussed today and is in agreement with them.  Lancelot Alyea Kirby Funk, MD

## 2022-12-26 ENCOUNTER — Other Ambulatory Visit: Payer: Self-pay | Admitting: Oncology

## 2022-12-26 DIAGNOSIS — D735 Infarction of spleen: Secondary | ICD-10-CM

## 2022-12-28 ENCOUNTER — Telehealth: Payer: Self-pay | Admitting: Oncology

## 2022-12-28 NOTE — Telephone Encounter (Signed)
Contacted pt to schedule an appt. Unable to reach via phone, voicemail was left.    Scheduling Message Entered by Rennis Harding A on 12/25/2022 at  5:33 PM Priority: Routine <No visit type provided>  Department: [facility]  Provider:  Scheduling Notes:  LABS 03-08-23  APPT 03-15-23

## 2023-03-10 ENCOUNTER — Other Ambulatory Visit: Payer: BC Managed Care – PPO

## 2023-03-11 ENCOUNTER — Inpatient Hospital Stay: Payer: BC Managed Care – PPO

## 2023-03-15 ENCOUNTER — Inpatient Hospital Stay: Payer: BC Managed Care – PPO | Attending: Oncology

## 2023-03-15 ENCOUNTER — Ambulatory Visit: Payer: BC Managed Care – PPO | Admitting: Oncology

## 2023-03-15 DIAGNOSIS — D539 Nutritional anemia, unspecified: Secondary | ICD-10-CM | POA: Diagnosis present

## 2023-03-15 DIAGNOSIS — D735 Infarction of spleen: Secondary | ICD-10-CM

## 2023-03-15 LAB — CBC WITH DIFFERENTIAL (CANCER CENTER ONLY)
Abs Immature Granulocytes: 0.01 10*3/uL (ref 0.00–0.07)
Basophils Absolute: 0 10*3/uL (ref 0.0–0.1)
Basophils Relative: 0 %
Eosinophils Absolute: 0.1 10*3/uL (ref 0.0–0.5)
Eosinophils Relative: 2 %
HCT: 34.8 % — ABNORMAL LOW (ref 36.0–46.0)
Hemoglobin: 10.6 g/dL — ABNORMAL LOW (ref 12.0–15.0)
Immature Granulocytes: 0 %
Lymphocytes Relative: 33 %
Lymphs Abs: 1.7 10*3/uL (ref 0.7–4.0)
MCH: 25.1 pg — ABNORMAL LOW (ref 26.0–34.0)
MCHC: 30.5 g/dL (ref 30.0–36.0)
MCV: 82.5 fL (ref 80.0–100.0)
Monocytes Absolute: 0.3 10*3/uL (ref 0.1–1.0)
Monocytes Relative: 7 %
Neutro Abs: 3 10*3/uL (ref 1.7–7.7)
Neutrophils Relative %: 58 %
Platelet Count: 214 10*3/uL (ref 150–400)
RBC: 4.22 MIL/uL (ref 3.87–5.11)
RDW: 18 % — ABNORMAL HIGH (ref 11.5–15.5)
WBC Count: 5.2 10*3/uL (ref 4.0–10.5)
nRBC: 0 % (ref 0.0–0.2)

## 2023-03-17 LAB — CARDIOLIPIN ANTIBODIES, IGG, IGM, IGA
Anticardiolipin IgA: 12 [APL'U]/mL — ABNORMAL HIGH (ref 0–11)
Anticardiolipin IgG: <9 [GPL'U]/mL (ref 0–14)
Anticardiolipin IgM: >150 [MPL'U]/mL — ABNORMAL HIGH (ref 0–12)

## 2023-03-18 NOTE — Progress Notes (Incomplete)
[facility]  [address] [place],  [place]  [address] [phone]  Clinic Day: 12/25/2022  Referring physician: Yvonne Kendall, NP  HISTORY OF PRESENT ILLNESS:  The patient is a 42 y.o. 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  She comes in today to go over labs to determine if she has an underlying hypercoagulable disorder.  Since her last visit, the patient has been doing well.  She remains compliant with her warfarin.  She only notices mild abdominal discomfort with a more extreme turn of her torso.  Initial labs from [facility] in June 2024 had shown the following:  Protein C Activity 72 (68-270%) Protein S Activity 40 (low; 64 -147%) Factor V Leiden Mutation NEGATIVE Antithrombin III Activity 90 (80 - 130%) POSITIVE Lupus Anticoagulant IgM Beta-2 Glycoprotein AB 7.0 (<20) IgG Beta-2 Glycoprotein AB <1.0 (<20) IgM Anticardiolipin AB >60 (0-11) IgG Anticardiolipin AB 7.0 (0 -23)  PHYSICAL EXAM:  There were no vitals taken for this visit. Wt Readings from Last 3 Encounters:  12/25/22 209 lb 4.8 oz (94.9 kg)  12/01/22 208 lb 3.2 oz (94.4 kg)  04/21/11 200 lb (90.7 kg)   There is no height or weight on file to calculate BMI. Performance status (ECOG): 0 - Asymptomatic Physical Exam Constitutional:      Appearance: Normal appearance. She is not ill-appearing.  HENT:     Mouth/Throat:     Mouth: Mucous membranes are moist.     Pharynx: Oropharynx is clear. No oropharyngeal exudate or posterior oropharyngeal erythema.  Cardiovascular:     Rate and Rhythm: Normal rate and regular rhythm.     Heart sounds: No murmur heard.    No friction rub. No gallop.  Pulmonary:     Effort: Pulmonary effort is normal. No respiratory distress.     Breath sounds: Normal breath sounds. No wheezing, rhonchi or rales.  Abdominal:     General: Bowel sounds are normal. There is no  distension.     Palpations: Abdomen is soft. There is splenomegaly (spleen palpated 6 cm below left costal margin). There is no mass.     Tenderness: There is no abdominal tenderness.  Musculoskeletal:        General: No swelling.     Right lower leg: No edema.     Left lower leg: No edema.  Lymphadenopathy:     Cervical: No cervical adenopathy.     Upper Body:     Right upper body: No supraclavicular or axillary adenopathy.     Left upper body: No supraclavicular or axillary adenopathy.     Lower Body: No right inguinal adenopathy. No left inguinal adenopathy.  Skin:    General: Skin is warm.     Coloration: Skin is not jaundiced.     Findings: No lesion or rash.  Neurological:     General: No focal deficit present.     Mental Status: She is alert and oriented to person, place, and time. Mental status is at baseline.  Psychiatric:        Mood and Affect: Mood normal.        Behavior: Behavior normal.        Thought Content: Thought content normal.    LABS:  Latest Reference Range & Units 03/15/23 13:22  WBC 4.0 - 10.5 K/uL 5.2  RBC 3.87 - 5.11 MIL/uL 4.22  Hemoglobin 12.0 - 15.0 g/dL 53.6 (L)  HCT 36.0 - 46.0 % 34.8 (L)  MCV 80.0 - 100.0 fL 82.5  MCH 26.0 - 34.0 pg 25.1 (L)  MCHC 30.0 - 36.0 g/dL 16.1  RDW 09.6 - 04.5 % 18.0 (H)  Platelets 150 - 400 K/uL 214  nRBC 0.0 - 0.2 % 0.0  Neutrophils % 58  Lymphocytes % 33  Monocytes Relative % 7  Eosinophil % 2  Basophil % 0  Immature Granulocytes % 0  (L): Data is abnormally low (H): Data is abnormally high  Latest Reference Range & Units 12/03/22 14:26 03/15/23 13:22  Anticardiolipin Ab,IgM,Qn 0 - 12 MPL U/mL >150 (H) >150 (H)  (H): Data is abnormally high  ASSESSMENT & PLAN:  A 42 y.o. 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  In clinic today, I reviewed her recently collected labs with her.  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  This was the 2nd time where this study came back elevated, but both studies were collected in June 2024.  In order for her to meet the diagnosis of antiphospholipid syndrome, this particular lab needs to remain elevated 3 months apart from when it was first checked.  Based upon this, I will recheck this value in September 2024.  If it is still very elevated at that time, she would meet the criteria for having antiphospholipid syndrome for which lifelong anticoagulation would be recommended.  She knows to remain on warfarin until further notice.  I will also check her CBC in September just to ensure her splenomegaly is not leading to progressive pancytopenia over time.  If so, a splenectomy may need to be considered.  The patient understands all the plans discussed today and is in agreement with them.  Stacee Earp Kirby Funk, MD

## 2023-03-18 NOTE — Progress Notes (Signed)
[facility]  [address] [place],  [place]  [address] [phone]  Clinic Day: 03/19/2023  Referring physician: Yvonne Kendall, NP  HISTORY OF PRESENT ILLNESS:  The patient is a 42 y.o. 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  She comes in today to go over labs to determine if she has an underlying hypercoagulable disorder.  Since her last visit, the patient has been doing well.  She remains compliant with her warfarin.  She denies having any significant abdominal pain or discomfort underlying splenomegaly.  Of note, initial labs from [facility] in June 2024 had shown the following:  Protein C Activity 72 (68-270%) Protein S Activity 40 (low; 64 -147%) Factor V Leiden Mutation NEGATIVE Antithrombin III Activity 90 (80 - 130%) POSITIVE Lupus Anticoagulant IgM Beta-2 Glycoprotein AB 7.0 (<20) IgG Beta-2 Glycoprotein AB <1.0 (<20) IgM Anticardiolipin AB >60 (0-11) IgG Anticardiolipin AB 7.0 (0 -23)  PHYSICAL EXAM:  Blood pressure (!) 147/73, pulse 66, temperature 99.1 F (37.3 C), resp. rate 18, height 5\' 6"  (1.676 m), weight 198 lb 6.4 oz (90 kg), SpO2 100%. Wt Readings from Last 3 Encounters:  03/19/23 198 lb 6.4 oz (90 kg)  12/25/22 209 lb 4.8 oz (94.9 kg)  12/01/22 208 lb 3.2 oz (94.4 kg)   Body mass index is 32.02 kg/m. Performance status (ECOG): 0 - Asymptomatic Physical Exam Constitutional:      Appearance: Normal appearance. She is not ill-appearing.  HENT:     Mouth/Throat:     Mouth: Mucous membranes are moist.     Pharynx: Oropharynx is clear. No oropharyngeal exudate or posterior oropharyngeal erythema.  Cardiovascular:     Rate and Rhythm: Normal rate and regular rhythm.     Heart sounds: No murmur heard.    No friction rub. No gallop.  Pulmonary:     Effort: Pulmonary effort is normal. No respiratory distress.     Breath sounds: Normal breath  sounds. No wheezing, rhonchi or rales.  Abdominal:     General: Bowel sounds are normal. There is no distension.     Palpations: Abdomen is soft. There is splenomegaly (spleen is still palpated 6 cm below left costal margin). There is no mass.     Tenderness: There is no abdominal tenderness.  Musculoskeletal:        General: No swelling.     Right lower leg: No edema.     Left lower leg: No edema.  Lymphadenopathy:     Cervical: No cervical adenopathy.     Upper Body:     Right upper body: No supraclavicular or axillary adenopathy.     Left upper body: No supraclavicular or axillary adenopathy.     Lower Body: No right inguinal adenopathy. No left inguinal adenopathy.  Skin:    General: Skin is warm.     Coloration: Skin is not jaundiced.     Findings: No lesion or rash.  Neurological:     General: No focal deficit present.     Mental Status: She is alert and oriented to person, place, and time. Mental status is at baseline.  Psychiatric:        Mood and Affect: Mood normal.        Behavior: Behavior normal.        Thought Content: Thought content normal.   LABS:  Latest Reference Range & Units 03/15/23 13:22  WBC 4.0 - 10.5 K/uL  5.2  RBC 3.87 - 5.11 MIL/uL 4.22  Hemoglobin 12.0 - 15.0 g/dL 16.1 (L)  HCT 09.6 - 04.5 % 34.8 (L)  MCV 80.0 - 100.0 fL 82.5  MCH 26.0 - 34.0 pg 25.1 (L)  MCHC 30.0 - 36.0 g/dL 40.9  RDW 81.1 - 91.4 % 18.0 (H)  Platelets 150 - 400 K/uL 214  nRBC 0.0 - 0.2 % 0.0  Neutrophils % 58  Lymphocytes % 33  Monocytes Relative % 7  Eosinophil % 2  Basophil % 0  Immature Granulocytes % 0  (L): Data is abnormally low (H): Data is abnormally high  Latest Reference Range & Units 12/03/22 14:26 03/15/23 13:22  Anticardiolipin Ab,IgM,Qn 0 - 12 MPL U/mL >150 (H) >150 (H)  (H): Data is abnormally high   Latest Reference Range & Units 03/19/23 11:57  Iron 28 - 170 ug/dL 52  UIBC ug/dL 782  TIBC 956 - 213 ug/dL 086  Saturation Ratios 10.4 - 31.8 % 14   Ferritin 11 - 307 ng/mL 14    ASSESSMENT & PLAN:  A 42 y.o. 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  When reviewing her recent labs, this patient's IgM anti-Cardiolipin antibody level remains very elevated 3 months out from its initial assessment.  Based upon this finding, she now meets the criteria for having antiphospholipid syndrome.  Based upon this, she will need to stay on lifelong anticoagulation.  Currently, she is on warfarin, which I have no problem with her continuing to take.  With respect to her splenomegaly, I will repeat another scan in 6 months to ensure it has not enlarged or that other abdominal pathology has developed that would suggest an underlying lymphoma or similar hematologic disorder is not present.  I will see her back at that time to go over her scans, as well as to ensure she has had no further problems as it pertains to her newly diagnosed antiphospholipid syndrome.  The patient understands all the plans discussed today and is in agreement with them.  Alexis Wesch Kirby Funk, MD

## 2023-03-19 ENCOUNTER — Other Ambulatory Visit: Payer: Self-pay | Admitting: Oncology

## 2023-03-19 ENCOUNTER — Inpatient Hospital Stay: Payer: BC Managed Care – PPO | Attending: Oncology | Admitting: Oncology

## 2023-03-19 VITALS — BP 147/73 | HR 66 | Temp 99.1°F | Resp 18 | Ht 66.0 in | Wt 198.4 lb

## 2023-03-19 DIAGNOSIS — R161 Splenomegaly, not elsewhere classified: Secondary | ICD-10-CM

## 2023-03-19 DIAGNOSIS — D735 Infarction of spleen: Secondary | ICD-10-CM

## 2023-03-19 DIAGNOSIS — D6862 Lupus anticoagulant syndrome: Secondary | ICD-10-CM | POA: Diagnosis present

## 2023-03-19 DIAGNOSIS — D539 Nutritional anemia, unspecified: Secondary | ICD-10-CM | POA: Diagnosis not present

## 2023-03-19 LAB — IRON AND TIBC
Iron: 52 ug/dL (ref 28–170)
Saturation Ratios: 14 % (ref 10.4–31.8)
TIBC: 367 ug/dL (ref 250–450)
UIBC: 315 ug/dL

## 2023-03-19 LAB — FERRITIN: Ferritin: 14 ng/mL (ref 11–307)

## 2023-09-06 ENCOUNTER — Encounter (HOSPITAL_BASED_OUTPATIENT_CLINIC_OR_DEPARTMENT_OTHER): Payer: Self-pay | Admitting: Radiology

## 2023-09-09 ENCOUNTER — Ambulatory Visit (INDEPENDENT_AMBULATORY_CARE_PROVIDER_SITE_OTHER)
Admission: RE | Admit: 2023-09-09 | Discharge: 2023-09-09 | Disposition: A | Discharge status: Home / Self Care | Source: Ambulatory Visit | Attending: Oncology | Admitting: Oncology

## 2023-09-09 DIAGNOSIS — R161 Splenomegaly, not elsewhere classified: Secondary | ICD-10-CM | POA: Diagnosis not present

## 2023-09-09 MED ORDER — IOHEXOL 300 MG/ML  SOLN
100.0000 mL | Freq: Once | INTRAMUSCULAR | Status: AC | PRN
  Administered 2023-09-09: 100 mL via INTRAVENOUS

## 2023-09-28 ENCOUNTER — Ambulatory Visit: Payer: BC Managed Care – PPO | Admitting: Oncology

## 2023-09-28 ENCOUNTER — Other Ambulatory Visit: Payer: BC Managed Care – PPO

## 2023-10-10 NOTE — Progress Notes (Deleted)
 [facility]  [address] [place],  [place]  [address] [phone]  Clinic Day: 03/19/2023  Referring physician: Charma Coon, NP  HISTORY OF PRESENT ILLNESS:  The patient is a 43 y.o. 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  She comes in today to go over labs to determine if she has an underlying hypercoagulable disorder.  Since her last visit, the patient has been doing well.  She remains compliant with her warfarin.  She denies having any significant abdominal pain or discomfort underlying splenomegaly.  Of note, initial labs from [facility] in June 2024 had shown the following:  Protein C Activity 72 (68-270%) Protein S Activity 40 (low; 64 -147%) Factor V Leiden Mutation NEGATIVE Antithrombin III Activity 90 (80 - 130%) POSITIVE Lupus Anticoagulant IgM Beta-2 Glycoprotein AB 7.0 (<20) IgG Beta-2 Glycoprotein AB <1.0 (<20) IgM Anticardiolipin AB >60 (0-11) IgG Anticardiolipin AB 7.0 (0 -23)  PHYSICAL EXAM:  There were no vitals taken for this visit. Wt Readings from Last 3 Encounters:  03/19/23 198 lb 6.4 oz (90 kg)  12/25/22 209 lb 4.8 oz (94.9 kg)  12/01/22 208 lb 3.2 oz (94.4 kg)   There is no height or weight on file to calculate BMI. Performance status (ECOG): 0 - Asymptomatic Physical Exam Constitutional:      Appearance: Normal appearance. She is not ill-appearing.  HENT:     Mouth/Throat:     Mouth: Mucous membranes are moist.     Pharynx: Oropharynx is clear. No oropharyngeal exudate or posterior oropharyngeal erythema.  Cardiovascular:     Rate and Rhythm: Normal rate and regular rhythm.     Heart sounds: No murmur heard.    No friction rub. No gallop.  Pulmonary:     Effort: Pulmonary effort is normal. No respiratory distress.     Breath sounds: Normal breath sounds. No wheezing, rhonchi or rales.  Abdominal:     General: Bowel sounds are  normal. There is no distension.     Palpations: Abdomen is soft. There is splenomegaly (spleen is still palpated 6 cm below left costal margin). There is no mass.     Tenderness: There is no abdominal tenderness.  Musculoskeletal:        General: No swelling.     Right lower leg: No edema.     Left lower leg: No edema.  Lymphadenopathy:     Cervical: No cervical adenopathy.     Upper Body:     Right upper body: No supraclavicular or axillary adenopathy.     Left upper body: No supraclavicular or axillary adenopathy.     Lower Body: No right inguinal adenopathy. No left inguinal adenopathy.  Skin:    General: Skin is warm.     Coloration: Skin is not jaundiced.     Findings: No lesion or rash.  Neurological:     General: No focal deficit present.     Mental Status: She is alert and oriented to person, place, and time. Mental status is at baseline.  Psychiatric:        Mood and Affect: Mood normal.        Behavior: Behavior normal.        Thought Content: Thought content normal.   LABS:  Latest Reference Range & Units 03/15/23 13:22  WBC 4.0 - 10.5 K/uL 5.2  RBC 3.87 - 5.11 MIL/uL 4.22  Hemoglobin 12.0 - 15.0 g/dL 25.3 (  L)  HCT 36.0 - 46.0 % 34.8 (L)  MCV 80.0 - 100.0 fL 82.5  MCH 26.0 - 34.0 pg 25.1 (L)  MCHC 30.0 - 36.0 g/dL 16.1  RDW 09.6 - 04.5 % 18.0 (H)  Platelets 150 - 400 K/uL 214  nRBC 0.0 - 0.2 % 0.0  Neutrophils % 58  Lymphocytes % 33  Monocytes Relative % 7  Eosinophil % 2  Basophil % 0  Immature Granulocytes % 0  (L): Data is abnormally low (H): Data is abnormally high  Latest Reference Range & Units 12/03/22 14:26 03/15/23 13:22  Anticardiolipin Ab,IgM,Qn 0 - 12 MPL U/mL >150 (H) >150 (H)  (H): Data is abnormally high   Latest Reference Range & Units 03/19/23 11:57  Iron 28 - 170 ug/dL 52  UIBC ug/dL 409  TIBC 811 - 914 ug/dL 782  Saturation Ratios 10.4 - 31.8 % 14  Ferritin 11 - 307 ng/mL 14    ASSESSMENT & PLAN:  A 43 y.o. 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  When reviewing her recent labs, this patient's IgM anti-Cardiolipin antibody level remains very elevated 3 months out from its initial assessment.  Based upon this finding, she now meets the criteria for having antiphospholipid syndrome.  Based upon this, she will need to stay on lifelong anticoagulation.  Currently, she is on warfarin, which I have no problem with her continuing to take.  With respect to her splenomegaly, I will repeat another scan in 6 months to ensure it has not enlarged or that other abdominal pathology has developed that would suggest an underlying lymphoma or similar hematologic disorder is not present.  I will see her back at that time to go over her scans, as well as to ensure she has had no further problems as it pertains to her newly diagnosed antiphospholipid syndrome.  The patient understands all the plans discussed today and is in agreement with them.  Kiril Hippe Felicia Horde, MD

## 2023-10-11 ENCOUNTER — Inpatient Hospital Stay: Admitting: Oncology

## 2023-10-11 ENCOUNTER — Inpatient Hospital Stay

## 2023-10-15 ENCOUNTER — Inpatient Hospital Stay: Attending: Oncology

## 2023-10-15 ENCOUNTER — Other Ambulatory Visit: Payer: Self-pay | Admitting: Oncology

## 2023-10-15 ENCOUNTER — Inpatient Hospital Stay (HOSPITAL_BASED_OUTPATIENT_CLINIC_OR_DEPARTMENT_OTHER): Admitting: Oncology

## 2023-10-15 ENCOUNTER — Telehealth: Payer: Self-pay

## 2023-10-15 VITALS — BP 113/47 | HR 64 | Temp 98.7°F | Resp 16 | Ht 66.0 in | Wt 210.6 lb

## 2023-10-15 DIAGNOSIS — D539 Nutritional anemia, unspecified: Secondary | ICD-10-CM

## 2023-10-15 DIAGNOSIS — D6861 Antiphospholipid syndrome: Secondary | ICD-10-CM | POA: Diagnosis present

## 2023-10-15 DIAGNOSIS — Z7901 Long term (current) use of anticoagulants: Secondary | ICD-10-CM | POA: Insufficient documentation

## 2023-10-15 DIAGNOSIS — R161 Splenomegaly, not elsewhere classified: Secondary | ICD-10-CM | POA: Diagnosis not present

## 2023-10-15 DIAGNOSIS — D508 Other iron deficiency anemias: Secondary | ICD-10-CM | POA: Diagnosis not present

## 2023-10-15 LAB — CBC WITH DIFFERENTIAL (CANCER CENTER ONLY)
Abs Immature Granulocytes: 0.02 10*3/uL (ref 0.00–0.07)
Basophils Absolute: 0 10*3/uL (ref 0.0–0.1)
Basophils Relative: 1 %
Eosinophils Absolute: 0.1 10*3/uL (ref 0.0–0.5)
Eosinophils Relative: 1 %
HCT: 29.9 % — ABNORMAL LOW (ref 36.0–46.0)
Hemoglobin: 8.8 g/dL — ABNORMAL LOW (ref 12.0–15.0)
Immature Granulocytes: 0 %
Lymphocytes Relative: 31 %
Lymphs Abs: 1.8 10*3/uL (ref 0.7–4.0)
MCH: 21.8 pg — ABNORMAL LOW (ref 26.0–34.0)
MCHC: 29.4 g/dL — ABNORMAL LOW (ref 30.0–36.0)
MCV: 74.2 fL — ABNORMAL LOW (ref 80.0–100.0)
Monocytes Absolute: 0.4 10*3/uL (ref 0.1–1.0)
Monocytes Relative: 6 %
Neutro Abs: 3.4 10*3/uL (ref 1.7–7.7)
Neutrophils Relative %: 61 %
Platelet Count: 266 10*3/uL (ref 150–400)
RBC: 4.03 MIL/uL (ref 3.87–5.11)
RDW: 14.9 % (ref 11.5–15.5)
WBC Count: 5.7 10*3/uL (ref 4.0–10.5)
nRBC: 0 % (ref 0.0–0.2)
nRBC: 0 /100{WBCs}

## 2023-10-15 LAB — IRON AND TIBC
Iron: 18 ug/dL — ABNORMAL LOW (ref 28–170)
Saturation Ratios: 5 % — ABNORMAL LOW (ref 10.4–31.8)
TIBC: 385 ug/dL (ref 250–450)
UIBC: 367 ug/dL

## 2023-10-15 LAB — FERRITIN: Ferritin: <3 ng/mL — ABNORMAL LOW (ref 11–307)

## 2023-10-15 LAB — LACTATE DEHYDROGENASE: LDH: 172 U/L (ref 98–192)

## 2023-10-15 NOTE — Telephone Encounter (Signed)
 Latest Reference Range & Units 10/15/23 11:08  Iron 28 - 170 ug/dL 18 (L)  UIBC ug/dL 098  TIBC 119 - 147 ug/dL 829  Saturation Ratios 10.4 - 31.8 % 5 (L)  Ferritin 11 - 307 ng/mL <3 (L)  (L): Data is abnormally low

## 2023-10-15 NOTE — Progress Notes (Unsigned)
 [facility]  [address] [place],  [place]  [address] [phone]  Clinic Day: 10/15/2023  Referring physician: Charma Coon, NP  HISTORY OF PRESENT ILLNESS:  The patient is a 43 y.o. female with antiphospholipid syndrome.  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  She essentially comes in today to go over her CT scans to ensure her spleen has not enlarged further or if there are other findings within her abdomen/pelvis which suggest her splenomegaly is due to a more systemic process.  Since her last visit, the patient has been doing well.  Despite being compliant with her warfarin, she has grown weary of the frequent INR checks to determine if she remains at an adequate anticoagulation level.  She denies having any new symptoms/findings which concern her for another clotting/infarct event having occurred since her last visit.   PHYSICAL EXAM:  Blood pressure (!) 113/47, pulse 64, temperature 98.7 F (37.1 C), temperature source Oral, resp. rate 16, height 5\' 6"  (1.676 m), weight 210 lb 9.6 oz (95.5 kg), SpO2 100%. Wt Readings from Last 3 Encounters:  10/15/23 210 lb 9.6 oz (95.5 kg)  03/19/23 198 lb 6.4 oz (90 kg)  12/25/22 209 lb 4.8 oz (94.9 kg)   Body mass index is 33.99 kg/m. Performance status (ECOG): 0 - Asymptomatic Physical Exam Constitutional:      Appearance: Normal appearance. She is not ill-appearing.  HENT:     Mouth/Throat:     Mouth: Mucous membranes are moist.     Pharynx: Oropharynx is clear. No oropharyngeal exudate or posterior oropharyngeal erythema.  Cardiovascular:     Rate and Rhythm: Normal rate and regular rhythm.     Heart sounds: No murmur heard.    No friction rub. No gallop.  Pulmonary:     Effort: Pulmonary effort is normal. No respiratory distress.     Breath sounds: Normal breath sounds. No wheezing, rhonchi or rales.  Abdominal:     General: Bowel sounds are normal. There is no  distension.     Palpations: Abdomen is soft. There is splenomegaly (spleen is still palpated 6 cm below left costal margin). There is no mass.     Tenderness: There is no abdominal tenderness.  Musculoskeletal:        General: No swelling.     Right lower leg: No edema.     Left lower leg: No edema.  Lymphadenopathy:     Cervical: No cervical adenopathy.     Upper Body:     Right upper body: No supraclavicular or axillary adenopathy.     Left upper body: No supraclavicular or axillary adenopathy.     Lower Body: No right inguinal adenopathy. No left inguinal adenopathy.  Skin:    General: Skin is warm.     Coloration: Skin is not jaundiced.     Findings: No lesion or rash.  Neurological:     General: No focal deficit present.     Mental Status: She is alert and oriented to person, place, and time. Mental status is at baseline.  Psychiatric:        Mood and Affect: Mood normal.        Behavior: Behavior normal.        Thought Content: Thought content normal.   SCANS:  CT scans of her abdomen/pelvis revealed the following: FINDINGS: Lower chest: Lungs are clear.  Hepatobiliary: Liver is within normal limits.  Gallbladder is unremarkable. No intrahepatic  extrahepatic ductal dilatation.  Pancreas: Within normal limits.  Spleen: Mild splenomegaly, measuring 16.0 cm in maximal axial dimension. 2.3 cm inferior splenic cyst.  Adrenals/Urinary Tract: Adrenal glands are within normal limits.  Kidneys are within limits. No hydronephrosis.  Bladder is underdistended but unremarkable.  Stomach/Bowel: Stomach is within normal limits.  No evidence of bowel obstruction.  Normal appendix (series 301/image 53).  No colonic wall thickening or inflammatory changes.  Vascular/Lymphatic: No evidence of abdominal aortic aneurysm.  No suspicious abdominopelvic lymphadenopathy.  Reproductive: Uterus and right ovary is within normal limits.  5.0 cm simple left ovarian cyst. No  follow-up is recommended.  Other: No abdominopelvic ascites.  Musculoskeletal: Visualized osseous structures are within normal limits.  IMPRESSION: Mild splenomegaly.   LABS:   Latest Reference Range & Units 12/03/22 14:26 03/15/23 13:22  Anticardiolipin Ab,IgM,Qn 0 - 12 MPL U/mL >150 (H) >150 (H)  (H): Data is abnormally high   ASSESSMENT & PLAN:  A 43 y.o. female with antiphospholipid syndrome.  In clinic today, I went over her CT scans with her, for which she could see her spleen is only minimally enlarged.  There is no lymphadenopathy elsewhere or other processes present which are concerning for her mildly enlarged spleen being part of a more systemic process.  Nevertheless, she understands as she has antiphospholipid syndrome, she will be on lifelong anticoagulation.  As mentioned previously, she wishes to be switched to a different agent as she is tired of getting her INRs getting frequently checked.  Based upon this, I will switch her to Eliquis, which she will take 10 mg BID x 1 week, followed by 5 mg BID indefinitely.   Overall, she appears to be doing well.  I will see her back in 6 months for repeat clinical assessment.  The patient understands all the plans discussed today and is in agreement with them.  Kaleo Condrey Felicia Horde, MD

## 2023-10-16 DIAGNOSIS — D6861 Antiphospholipid syndrome: Secondary | ICD-10-CM | POA: Insufficient documentation

## 2023-10-18 ENCOUNTER — Encounter: Payer: Self-pay | Admitting: Oncology

## 2023-10-18 ENCOUNTER — Telehealth: Payer: Self-pay | Admitting: Oncology

## 2023-10-18 DIAGNOSIS — D509 Iron deficiency anemia, unspecified: Secondary | ICD-10-CM | POA: Insufficient documentation

## 2023-10-18 NOTE — Addendum Note (Signed)
 Addended by: Ezell Hollow on: 10/18/2023 12:41 PM   Modules accepted: Orders

## 2023-10-18 NOTE — Telephone Encounter (Signed)
 Contacted pt to schedule an appt. Unable to reach via phone, voicemail was left.    Scheduling Message Entered by Orr, AMY W on 10/18/2023 at 12:27 PM Priority: High INFUSION 1HR30MIN (90)  Department: [facility]  Provider: Deloria Fetch, MD  Appointment Notes:  Neeeds IV iron per Harles Lied

## 2023-10-20 ENCOUNTER — Telehealth: Payer: Self-pay | Admitting: Oncology

## 2023-10-20 NOTE — Telephone Encounter (Signed)
 Patient has been scheduled for follow-up visit per 10/20/23 LOS.  Pt aware of scheduled appt details.

## 2023-10-29 ENCOUNTER — Inpatient Hospital Stay

## 2023-10-29 VITALS — BP 119/57 | HR 75 | Temp 97.7°F | Resp 18

## 2023-10-29 DIAGNOSIS — D508 Other iron deficiency anemias: Secondary | ICD-10-CM

## 2023-10-29 DIAGNOSIS — D6861 Antiphospholipid syndrome: Secondary | ICD-10-CM | POA: Diagnosis not present

## 2023-10-29 MED ORDER — SODIUM CHLORIDE 0.9 % IV SOLN
510.0000 mg | Freq: Once | INTRAVENOUS | Status: AC
  Administered 2023-10-29: 510 mg via INTRAVENOUS
  Filled 2023-10-29: qty 510

## 2023-10-29 MED ORDER — SODIUM CHLORIDE 0.9 % IV SOLN: INTRAVENOUS | Status: DC

## 2023-10-29 NOTE — Patient Instructions (Signed)

## 2023-11-03 ENCOUNTER — Inpatient Hospital Stay

## 2023-11-03 VITALS — BP 107/51 | HR 80 | Temp 97.7°F | Resp 18

## 2023-11-03 DIAGNOSIS — D508 Other iron deficiency anemias: Secondary | ICD-10-CM

## 2023-11-03 DIAGNOSIS — D6861 Antiphospholipid syndrome: Secondary | ICD-10-CM | POA: Diagnosis not present

## 2023-11-03 MED ORDER — SODIUM CHLORIDE 0.9 % IV SOLN: INTRAVENOUS | Status: DC

## 2023-11-03 MED ORDER — SODIUM CHLORIDE 0.9 % IV SOLN
INTRAVENOUS | Status: DC
Start: 2023-11-03 — End: 2023-11-03

## 2023-11-03 MED ORDER — SODIUM CHLORIDE 0.9 % IV SOLN
510.0000 mg | Freq: Once | INTRAVENOUS | Status: AC
  Administered 2023-11-03: 510 mg via INTRAVENOUS
  Filled 2023-11-03: qty 510

## 2023-11-03 NOTE — Patient Instructions (Signed)

## 2023-11-09 ENCOUNTER — Telehealth: Payer: Self-pay

## 2023-11-09 ENCOUNTER — Other Ambulatory Visit: Payer: Self-pay | Admitting: Hematology and Oncology

## 2023-11-09 MED ORDER — APIXABAN 5 MG PO TABS: ORAL_TABLET | ORAL | 0 refills | Status: DC

## 2023-11-09 NOTE — Telephone Encounter (Signed)
 ASSESSMENT & PLAN:  A 43 y.o. female with antiphospholipid syndrome.  In clinic today, I went over her CT scans with her, for which she could see her spleen is only minimally enlarged.  There is no lymphadenopathy elsewhere or other processes present which are concerning for her mildly enlarged spleen being part of a more systemic process.  Nevertheless, she understands as she has antiphospholipid syndrome, she will be on lifelong anticoagulation.  As mentioned previously, she wishes to be switched to a different agent as she is tired of getting her INRs getting frequently checked.  Based upon this, I will switch her to Eliquis, which she will take 10 mg BID x 1 week, followed by 5 mg BID indefinitely.   Overall, she appears to be doing well.  I will see her back in 6 months for repeat clinical assessment.  The patient understands all the plans discussed today and is in agreement with them.   Deloria Fetch, MD                Electronically signed by Deloria Fetch, MD at 10/16/2023  3:08 PM

## 2023-11-24 LAB — COLOGUARD: COLOGUARD: NEGATIVE

## 2024-01-20 ENCOUNTER — Telehealth: Payer: Self-pay

## 2024-01-20 NOTE — Telephone Encounter (Signed)
 Pt reports that she has been on her menstrual cycle for 2 weeks now. She takes coumadin daily, alternating 4mg  qd with 5mg  qd. Please advise. Last Hgb 8.8 in May 2025

## 2024-01-24 ENCOUNTER — Inpatient Hospital Stay: Attending: Oncology

## 2024-01-24 ENCOUNTER — Other Ambulatory Visit: Payer: Self-pay | Admitting: Oncology

## 2024-01-24 DIAGNOSIS — D509 Iron deficiency anemia, unspecified: Secondary | ICD-10-CM | POA: Diagnosis not present

## 2024-01-24 DIAGNOSIS — D6861 Antiphospholipid syndrome: Secondary | ICD-10-CM | POA: Diagnosis present

## 2024-01-24 DIAGNOSIS — D508 Other iron deficiency anemias: Secondary | ICD-10-CM

## 2024-01-24 DIAGNOSIS — Z7901 Long term (current) use of anticoagulants: Secondary | ICD-10-CM | POA: Insufficient documentation

## 2024-01-24 DIAGNOSIS — R161 Splenomegaly, not elsewhere classified: Secondary | ICD-10-CM | POA: Diagnosis not present

## 2024-01-24 LAB — CBC WITH DIFFERENTIAL (CANCER CENTER ONLY)
Abs Immature Granulocytes: 0.02 K/uL (ref 0.00–0.07)
Basophils Absolute: 0 K/uL (ref 0.0–0.1)
Basophils Relative: 1 %
Eosinophils Absolute: 0.1 K/uL (ref 0.0–0.5)
Eosinophils Relative: 1 %
HCT: 36.5 % (ref 36.0–46.0)
Hemoglobin: 12.3 g/dL (ref 12.0–15.0)
Immature Granulocytes: 0 %
Lymphocytes Relative: 30 %
Lymphs Abs: 2.5 K/uL (ref 0.7–4.0)
MCH: 28.1 pg (ref 26.0–34.0)
MCHC: 33.7 g/dL (ref 30.0–36.0)
MCV: 83.5 fL (ref 80.0–100.0)
Monocytes Absolute: 0.5 K/uL (ref 0.1–1.0)
Monocytes Relative: 6 %
Neutro Abs: 5.1 K/uL (ref 1.7–7.7)
Neutrophils Relative %: 62 %
Platelet Count: 318 K/uL (ref 150–400)
RBC: 4.37 MIL/uL (ref 3.87–5.11)
RDW: 15.9 % — ABNORMAL HIGH (ref 11.5–15.5)
WBC Count: 8.1 K/uL (ref 4.0–10.5)
nRBC: 0 % (ref 0.0–0.2)

## 2024-01-24 LAB — IRON AND TIBC
Iron: 39 ug/dL (ref 28–170)
Saturation Ratios: 12 % (ref 10.4–31.8)
TIBC: 335 ug/dL (ref 250–450)
UIBC: 296 ug/dL

## 2024-01-24 LAB — PROTIME-INR
INR: 2.1 — ABNORMAL HIGH (ref 0.8–1.2)
Prothrombin Time: 24.5 s — ABNORMAL HIGH (ref 11.4–15.2)

## 2024-01-24 LAB — FERRITIN: Ferritin: 8 ng/mL — ABNORMAL LOW (ref 11–307)

## 2024-01-25 ENCOUNTER — Telehealth: Payer: Self-pay | Admitting: Hematology and Oncology

## 2024-01-25 ENCOUNTER — Other Ambulatory Visit: Payer: Self-pay | Admitting: Hematology and Oncology

## 2024-01-25 NOTE — Telephone Encounter (Addendum)
 Latest Reference Range & Units 01/24/24 15:38  Prothrombin Time 11.4 - 15.2 seconds 24.5 (H)  INR 0.8 - 1.2  2.1 (H)  (H): Data is abnormally high  Latest Reference Range & Units 01/24/24 15:38  Hemoglobin 12.0 - 15.0 g/dL 87.6  HCT 63.9 - 53.9 % 36.5     Latest Reference Range & Units 01/24/24 15:38  Iron 28 - 170 ug/dL 39  UIBC ug/dL 703  TIBC 749 - 549 ug/dL 664  Saturation Ratios 10.4 - 31.8 % 12  Ferritin 11 - 307 ng/mL 8 (L)  (L): Data is abnormally low

## 2024-01-25 NOTE — Telephone Encounter (Signed)
 Contacted pt to schedule IV Iron infusions. Unable to reach via phone, voicemail was left.

## 2024-01-27 ENCOUNTER — Other Ambulatory Visit: Payer: Self-pay | Admitting: Hematology and Oncology

## 2024-02-15 ENCOUNTER — Inpatient Hospital Stay: Attending: Oncology

## 2024-02-15 ENCOUNTER — Encounter: Payer: Self-pay | Admitting: Hematology and Oncology

## 2024-02-15 VITALS — BP 118/63 | HR 73 | Temp 98.1°F | Resp 16

## 2024-02-15 DIAGNOSIS — D508 Other iron deficiency anemias: Secondary | ICD-10-CM

## 2024-02-15 DIAGNOSIS — Z79899 Other long term (current) drug therapy: Secondary | ICD-10-CM | POA: Diagnosis not present

## 2024-02-15 DIAGNOSIS — D6861 Antiphospholipid syndrome: Secondary | ICD-10-CM | POA: Insufficient documentation

## 2024-02-15 DIAGNOSIS — D509 Iron deficiency anemia, unspecified: Secondary | ICD-10-CM | POA: Diagnosis not present

## 2024-02-15 MED ORDER — SODIUM CHLORIDE 0.9 % IV SOLN
510.0000 mg | Freq: Once | INTRAVENOUS | Status: AC
  Administered 2024-02-15: 510 mg via INTRAVENOUS
  Filled 2024-02-15: qty 510

## 2024-02-15 MED ORDER — SODIUM CHLORIDE 0.9 % IV SOLN: INTRAVENOUS | Status: DC

## 2024-02-15 NOTE — Patient Instructions (Signed)

## 2024-02-16 ENCOUNTER — Encounter: Payer: Self-pay | Admitting: Hematology and Oncology

## 2024-02-21 ENCOUNTER — Inpatient Hospital Stay

## 2024-02-21 VITALS — BP 132/62 | HR 83 | Temp 97.6°F | Resp 18

## 2024-02-21 DIAGNOSIS — D6861 Antiphospholipid syndrome: Secondary | ICD-10-CM | POA: Diagnosis not present

## 2024-02-21 DIAGNOSIS — D508 Other iron deficiency anemias: Secondary | ICD-10-CM

## 2024-02-21 DIAGNOSIS — D509 Iron deficiency anemia, unspecified: Secondary | ICD-10-CM | POA: Diagnosis not present

## 2024-02-21 MED ORDER — SODIUM CHLORIDE 0.9 % IV SOLN: INTRAVENOUS | Status: DC

## 2024-02-21 MED ORDER — SODIUM CHLORIDE 0.9 % IV SOLN
510.0000 mg | Freq: Once | INTRAVENOUS | Status: AC
  Administered 2024-02-21: 510 mg via INTRAVENOUS
  Filled 2024-02-21: qty 510

## 2024-02-21 NOTE — Patient Instructions (Signed)

## 2024-02-21 NOTE — Progress Notes (Signed)
 Pt declined the 30 min wait time. D/c stable

## 2024-02-29 ENCOUNTER — Other Ambulatory Visit: Payer: Self-pay

## 2024-02-29 DIAGNOSIS — D6861 Antiphospholipid syndrome: Secondary | ICD-10-CM

## 2024-02-29 MED ORDER — WARFARIN SODIUM 4 MG PO TABS: 4.0000 mg | ORAL_TABLET | ORAL | 0 refills | Status: AC

## 2024-04-16 NOTE — Progress Notes (Deleted)
 [facility]  [address] [place],  [place]  [address] [phone]  Clinic Day: 10/15/2023  Referring physician: [facility]  HISTORY OF PRESENT ILLNESS:  The patient is a 43 y.o. female with antiphospholipid syndrome.  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  She essentially comes in today to go over her CT scans to ensure her spleen has not enlarged further or if there are other findings within her abdomen/pelvis which suggest her splenomegaly is due to a more systemic process.  Since her last visit, the patient has been doing well.  Despite being compliant with her warfarin, she has grown weary of the frequent INR checks to determine if she remains at an adequate anticoagulation level.  She denies having any new symptoms/findings which concern her for another clotting/infarct event having occurred since her last visit.   PHYSICAL EXAM:  There were no vitals taken for this visit. Wt Readings from Last 3 Encounters:  10/15/23 210 lb 9.6 oz (95.5 kg)  03/19/23 198 lb 6.4 oz (90 kg)  12/25/22 209 lb 4.8 oz (94.9 kg)   There is no height or weight on file to calculate BMI. Performance status (ECOG): 0 - Asymptomatic Physical Exam Constitutional:      Appearance: Normal appearance. She is not ill-appearing.  HENT:     Mouth/Throat:     Mouth: Mucous membranes are moist.     Pharynx: Oropharynx is clear. No oropharyngeal exudate or posterior oropharyngeal erythema.  Cardiovascular:     Rate and Rhythm: Normal rate and regular rhythm.     Heart sounds: No murmur heard.    No friction rub. No gallop.  Pulmonary:     Effort: Pulmonary effort is normal. No respiratory distress.     Breath sounds: Normal breath sounds. No wheezing, rhonchi or rales.  Abdominal:     General: Bowel sounds are normal. There is no distension.     Palpations: Abdomen is soft. There is splenomegaly (spleen is still palpated 6 cm below  left costal margin). There is no mass.     Tenderness: There is no abdominal tenderness.  Musculoskeletal:        General: No swelling.     Right lower leg: No edema.     Left lower leg: No edema.  Lymphadenopathy:     Cervical: No cervical adenopathy.     Upper Body:     Right upper body: No supraclavicular or axillary adenopathy.     Left upper body: No supraclavicular or axillary adenopathy.     Lower Body: No right inguinal adenopathy. No left inguinal adenopathy.  Skin:    General: Skin is warm.     Coloration: Skin is not jaundiced.     Findings: No lesion or rash.  Neurological:     General: No focal deficit present.     Mental Status: She is alert and oriented to person, place, and time. Mental status is at baseline.  Psychiatric:        Mood and Affect: Mood normal.        Behavior: Behavior normal.        Thought Content: Thought content normal.   SCANS:  CT scans of her abdomen/pelvis revealed the following: FINDINGS: Lower chest: Lungs are clear.  Hepatobiliary: Liver is within normal limits.  Gallbladder is unremarkable. No intrahepatic extrahepatic ductal dilatation.  Pancreas: Within normal limits.  Spleen: Mild splenomegaly, measuring 16.0 cm in maximal axial  dimension. 2.3 cm inferior splenic cyst.  Adrenals/Urinary Tract: Adrenal glands are within normal limits.  Kidneys are within limits. No hydronephrosis.  Bladder is underdistended but unremarkable.  Stomach/Bowel: Stomach is within normal limits.  No evidence of bowel obstruction.  Normal appendix (series 301/image 53).  No colonic wall thickening or inflammatory changes.  Vascular/Lymphatic: No evidence of abdominal aortic aneurysm.  No suspicious abdominopelvic lymphadenopathy.  Reproductive: Uterus and right ovary is within normal limits.  5.0 cm simple left ovarian cyst. No follow-up is recommended.  Other: No abdominopelvic ascites.  Musculoskeletal: Visualized osseous  structures are within normal limits.  IMPRESSION: Mild splenomegaly.   LABS:   Latest Reference Range & Units 12/03/22 14:26 03/15/23 13:22  Anticardiolipin Ab,IgM,Qn 0 - 12 MPL U/mL >150 (H) >150 (H)  (H): Data is abnormally high   ASSESSMENT & PLAN:  A 43 y.o. female with antiphospholipid syndrome.  In clinic today, I went over her CT scans with her, for which she could see her spleen is only minimally enlarged.  There is no lymphadenopathy elsewhere or other processes present which are concerning for her mildly enlarged spleen being part of a more systemic process.  Nevertheless, she understands as she has antiphospholipid syndrome, she will be on lifelong anticoagulation.  As mentioned previously, she wishes to be switched to a different agent as she is tired of getting her INRs getting frequently checked.  Based upon this, I will switch her to Eliquis , which she will take 10 mg BID x 1 week, followed by 5 mg BID indefinitely.   Overall, she appears to be doing well.  I will see her back in 6 months for repeat clinical assessment.  The patient understands all the plans discussed today and is in agreement with them.  Shatona Andujar DELENA Kerns, MD

## 2024-04-17 ENCOUNTER — Inpatient Hospital Stay

## 2024-04-17 ENCOUNTER — Inpatient Hospital Stay: Admitting: Oncology

## 2024-04-20 NOTE — Progress Notes (Unsigned)
 [facility]  [address] [place],  [place]  [address] [phone]  Clinic Day: 04/21/2024  Referring physician: [facility]  HISTORY OF PRESENT ILLNESS:  The patient is a 43 y.o. female with antiphospholipid syndrome.  Recent labs also show evidence of iron deficiency anemia to where she received IV iron in September 2025 she comes in today to reassess her iron and hemoglobin levels.  Overall, the patient claims to be feeling much better since her IV iron was given.  Of note, the plan was for this patient to switch to Eliquis  for the management of her antiphospholipid syndrome.  However, as the cost of monthly Eliquis  was too high, the patient remains on warfarin.  She readily admits her INR levels have not been at therapeutic levels recently.  Her INR is being monitored closely at her primary care office.  PHYSICAL EXAM:  There were no vitals taken for this visit. Wt Readings from Last 3 Encounters:  10/15/23 210 lb 9.6 oz (95.5 kg)  03/19/23 198 lb 6.4 oz (90 kg)  12/25/22 209 lb 4.8 oz (94.9 kg)   There is no height or weight on file to calculate BMI. Performance status (ECOG): 0 - Asymptomatic Physical Exam Constitutional:      Appearance: Normal appearance. She is not ill-appearing.  HENT:     Mouth/Throat:     Mouth: Mucous membranes are moist.     Pharynx: Oropharynx is clear. No oropharyngeal exudate or posterior oropharyngeal erythema.  Cardiovascular:     Rate and Rhythm: Normal rate and regular rhythm.     Heart sounds: No murmur heard.    No friction rub. No gallop.  Pulmonary:     Effort: Pulmonary effort is normal. No respiratory distress.     Breath sounds: Normal breath sounds. No wheezing, rhonchi or rales.  Abdominal:     General: Bowel sounds are normal. There is no distension.     Palpations: Abdomen is soft. There is splenomegaly (spleen is still palpated 6 cm below left costal margin). There is no mass.      Tenderness: There is no abdominal tenderness.  Musculoskeletal:        General: No swelling.     Right lower leg: No edema.     Left lower leg: No edema.  Lymphadenopathy:     Cervical: No cervical adenopathy.     Upper Body:     Right upper body: No supraclavicular or axillary adenopathy.     Left upper body: No supraclavicular or axillary adenopathy.     Lower Body: No right inguinal adenopathy. No left inguinal adenopathy.  Skin:    General: Skin is warm.     Coloration: Skin is not jaundiced.     Findings: No lesion or rash.  Neurological:     General: No focal deficit present.     Mental Status: She is alert and oriented to person, place, and time. Mental status is at baseline.  Psychiatric:        Mood and Affect: Mood normal.        Behavior: Behavior normal.        Thought Content: Thought content normal.   SCANS:  CT scans of her abdomen/pelvis revealed the following: FINDINGS: Lower chest: Lungs are clear.  Hepatobiliary: Liver is within normal limits.  Gallbladder is unremarkable. No intrahepatic extrahepatic ductal dilatation.  Pancreas: Within normal limits.  Spleen: Mild splenomegaly, measuring 16.0 cm in maximal axial dimension. 2.3 cm  inferior splenic cyst.  Adrenals/Urinary Tract: Adrenal glands are within normal limits.  Kidneys are within limits. No hydronephrosis.  Bladder is underdistended but unremarkable.  Stomach/Bowel: Stomach is within normal limits.  No evidence of bowel obstruction.  Normal appendix (series 301/image 53).  No colonic wall thickening or inflammatory changes.  Vascular/Lymphatic: No evidence of abdominal aortic aneurysm.  No suspicious abdominopelvic lymphadenopathy.  Reproductive: Uterus and right ovary is within normal limits.  5.0 cm simple left ovarian cyst. No follow-up is recommended.  Other: No abdominopelvic ascites.  Musculoskeletal: Visualized osseous structures are within  normal limits.  IMPRESSION: Mild splenomegaly.   LABS:   Latest Reference Range & Units 12/03/22 14:26 03/15/23 13:22  Anticardiolipin Ab,IgM,Qn 0 - 12 MPL U/mL >150 (H) >150 (H)  (H): Data is abnormally high   ASSESSMENT & PLAN:  A 43 y.o. female with antiphospholipid syndrome.  In clinic today, I went over her CT scans with her, for which she could see her spleen is only minimally enlarged.  There is no lymphadenopathy elsewhere or other processes present which are concerning for her mildly enlarged spleen being part of a more systemic process.  Nevertheless, she understands as she has antiphospholipid syndrome, she will be on lifelong anticoagulation.  As mentioned previously, she wishes to be switched to a different agent as she is tired of getting her INRs getting frequently checked.  Based upon this, I will switch her to Eliquis , which she will take 10 mg BID x 1 week, followed by 5 mg BID indefinitely.   Overall, she appears to be doing well.  I will see her back in 6 months for repeat clinical assessment.  The patient understands all the plans discussed today and is in agreement with them.  Alexis Frazier DELENA Kerns, MD

## 2024-04-21 ENCOUNTER — Other Ambulatory Visit: Payer: Self-pay | Admitting: Oncology

## 2024-04-21 ENCOUNTER — Inpatient Hospital Stay: Attending: Oncology

## 2024-04-21 ENCOUNTER — Inpatient Hospital Stay (HOSPITAL_BASED_OUTPATIENT_CLINIC_OR_DEPARTMENT_OTHER): Admitting: Oncology

## 2024-04-21 VITALS — BP 115/55 | HR 82 | Temp 98.2°F | Resp 14 | Ht 66.0 in | Wt 223.0 lb

## 2024-04-21 DIAGNOSIS — D508 Other iron deficiency anemias: Secondary | ICD-10-CM

## 2024-04-21 DIAGNOSIS — D509 Iron deficiency anemia, unspecified: Secondary | ICD-10-CM | POA: Diagnosis not present

## 2024-04-21 DIAGNOSIS — D539 Nutritional anemia, unspecified: Secondary | ICD-10-CM

## 2024-04-21 DIAGNOSIS — D500 Iron deficiency anemia secondary to blood loss (chronic): Secondary | ICD-10-CM

## 2024-04-21 LAB — CBC WITH DIFFERENTIAL (CANCER CENTER ONLY)
Abs Immature Granulocytes: 0.04 K/uL (ref 0.00–0.07)
Basophils Absolute: 0 K/uL (ref 0.0–0.1)
Basophils Relative: 0 %
Eosinophils Absolute: 0.1 K/uL (ref 0.0–0.5)
Eosinophils Relative: 1 %
HCT: 39.7 % (ref 36.0–46.0)
Hemoglobin: 13.9 g/dL (ref 12.0–15.0)
Immature Granulocytes: 0 %
Lymphocytes Relative: 25 %
Lymphs Abs: 2.4 K/uL (ref 0.7–4.0)
MCH: 30.3 pg (ref 26.0–34.0)
MCHC: 35 g/dL (ref 30.0–36.0)
MCV: 86.7 fL (ref 80.0–100.0)
Monocytes Absolute: 0.5 K/uL (ref 0.1–1.0)
Monocytes Relative: 5 %
Neutro Abs: 6.6 K/uL (ref 1.7–7.7)
Neutrophils Relative %: 69 %
Platelet Count: 291 K/uL (ref 150–400)
RBC: 4.58 MIL/uL (ref 3.87–5.11)
RDW: 14.6 % (ref 11.5–15.5)
WBC Count: 9.7 K/uL (ref 4.0–10.5)
nRBC: 0 % (ref 0.0–0.2)

## 2024-04-21 LAB — IRON AND TIBC
Iron: 58 ug/dL (ref 28–170)
Saturation Ratios: 23 % (ref 10.4–31.8)
TIBC: 246 ug/dL — ABNORMAL LOW (ref 250–450)
UIBC: 189 ug/dL

## 2024-04-21 LAB — FERRITIN: Ferritin: 152 ng/mL (ref 11–307)

## 2024-04-23 ENCOUNTER — Encounter: Payer: Self-pay | Admitting: Hematology and Oncology

## 2024-04-24 ENCOUNTER — Telehealth: Payer: Self-pay | Admitting: Oncology

## 2024-04-24 NOTE — Telephone Encounter (Signed)
 Patient has been scheduled for follow-up visit per 04/21/24 LOS.  LVM notifying pt of appt details, provided my direct number to pt if appt changes need to be made.

## 2024-10-19 ENCOUNTER — Inpatient Hospital Stay

## 2024-10-19 ENCOUNTER — Inpatient Hospital Stay: Admitting: Oncology
